# Patient Record
Sex: Male | Born: 1980 | Race: White | Hispanic: No | Marital: Single | Smoking: Current every day smoker
Health system: Southern US, Community
[De-identification: ages and names within clinical notes are randomized; demographics above are authoritative.]

## PROBLEM LIST (undated history)

## (undated) DIAGNOSIS — F329 Major depressive disorder, single episode, unspecified: Secondary | ICD-10-CM

## (undated) DIAGNOSIS — F419 Anxiety disorder, unspecified: Secondary | ICD-10-CM

## (undated) DIAGNOSIS — F1010 Alcohol abuse, uncomplicated: Secondary | ICD-10-CM

## (undated) DIAGNOSIS — G4700 Insomnia, unspecified: Secondary | ICD-10-CM

## (undated) DIAGNOSIS — F32A Depression, unspecified: Secondary | ICD-10-CM

---

## 2015-03-16 ENCOUNTER — Encounter: Payer: Self-pay | Admitting: Family Medicine

## 2015-03-16 ENCOUNTER — Ambulatory Visit (INDEPENDENT_AMBULATORY_CARE_PROVIDER_SITE_OTHER): Payer: 59 | Admitting: Family Medicine

## 2015-03-16 VITALS — BP 152/95 | HR 83 | Temp 98.9°F | Resp 18 | Ht 77.0 in | Wt 234.0 lb

## 2015-03-16 DIAGNOSIS — F4322 Adjustment disorder with anxiety: Secondary | ICD-10-CM | POA: Diagnosis not present

## 2015-03-16 DIAGNOSIS — R42 Dizziness and giddiness: Secondary | ICD-10-CM

## 2015-03-16 DIAGNOSIS — R0789 Other chest pain: Secondary | ICD-10-CM

## 2015-03-16 DIAGNOSIS — F410 Panic disorder [episodic paroxysmal anxiety] without agoraphobia: Secondary | ICD-10-CM

## 2015-03-16 DIAGNOSIS — E871 Hypo-osmolality and hyponatremia: Secondary | ICD-10-CM

## 2015-03-16 DIAGNOSIS — F1010 Alcohol abuse, uncomplicated: Secondary | ICD-10-CM | POA: Diagnosis not present

## 2015-03-16 LAB — POCT CBC
Granulocyte percent: 80.1 %G — AB (ref 37–80)
HCT, POC: 46.4 % (ref 43.5–53.7)
Hemoglobin: 15.5 g/dL (ref 14.1–18.1)
LYMPH, POC: 1.5 (ref 0.6–3.4)
MCH, POC: 31 pg (ref 27–31.2)
MCHC: 33.3 g/dL (ref 31.8–35.4)
MCV: 92.9 fL (ref 80–97)
MID (CBC): 0.3 (ref 0–0.9)
MPV: 7.9 fL (ref 0–99.8)
POC Granulocyte: 7 — AB (ref 2–6.9)
POC LYMPH %: 16.9 % (ref 10–50)
POC MID %: 3 %M (ref 0–12)
Platelet Count, POC: 117 10*3/uL — AB (ref 142–424)
RBC: 4.99 M/uL (ref 4.69–6.13)
RDW, POC: 13 %
WBC: 8.8 10*3/uL (ref 4.6–10.2)

## 2015-03-16 LAB — COMPREHENSIVE METABOLIC PANEL
ALBUMIN: 4.5 g/dL (ref 3.6–5.1)
ALT: 64 U/L — AB (ref 9–46)
AST: 49 U/L — AB (ref 10–40)
Alkaline Phosphatase: 52 U/L (ref 40–115)
BILIRUBIN TOTAL: 0.8 mg/dL (ref 0.2–1.2)
BUN: 8 mg/dL (ref 7–25)
CO2: 25 mmol/L (ref 20–31)
CREATININE: 0.73 mg/dL (ref 0.60–1.35)
Calcium: 8.9 mg/dL (ref 8.6–10.3)
Chloride: 88 mmol/L — ABNORMAL LOW (ref 98–110)
Glucose, Bld: 102 mg/dL — ABNORMAL HIGH (ref 65–99)
Potassium: 4.4 mmol/L (ref 3.5–5.3)
SODIUM: 125 mmol/L — AB (ref 135–146)
TOTAL PROTEIN: 7.4 g/dL (ref 6.1–8.1)

## 2015-03-16 LAB — TSH: TSH: 1.162 u[IU]/mL (ref 0.350–4.500)

## 2015-03-16 LAB — TROPONIN I

## 2015-03-16 LAB — GLUCOSE, POCT (MANUAL RESULT ENTRY): POC GLUCOSE: 103 mg/dL — AB (ref 70–99)

## 2015-03-16 MED ORDER — CHLORDIAZEPOXIDE HCL 25 MG PO CAPS: ORAL_CAPSULE | ORAL | Status: DC

## 2015-03-16 NOTE — Patient Instructions (Addendum)
I am sorry that you are having such a hard time today- it seems that your symptoms are caused by anxiety and also probably an element of alcohol withdrawal I will call you if the rest of your labs show any concerning findings. Otherwise we are going to have you use the librium as needed for anxiety and shakiness.  Remember while you are taking this you should not drink alcohol.    Coming off of alcohol can be difficult and sometimes dangerous.  Please have a friend or family member check on you a few times a day for the next few days.  If you are not doing ok, please go to the ER.   Otherwise it would be best to recheck you here in clinic tomorrow - this can be done at your convenience.    Quitting drinking may be a challenge- I would suggest that you join a support group of some type (AA or similar)- this can be really helpful.

## 2015-03-16 NOTE — Progress Notes (Addendum)
[facility] [address] [place] [address] [phone]  Date:  03/16/2015   Name:  Christopher Reid   DOB:  [birth year]   MRN:  [id number]  PCP:  No primary care provider on file.    Chief Complaint: No chief complaint on file.   History of Present Illness:  Christopher Reid is a 34 y.o. very pleasant male patient who presents with the following:  He has recently noted intermittent episodes of his eyes twitching, his "toes going numb."  He had some left sided chest pain- this started this morning while he was driving home.  It lasted maybe an hour and is now resolved He does not normally get chest pain  No SOB.    He has been under a lot of stress recnetly- for the last 6 months or so he feels that his job has been overwhelming.    He has never had any heart problems.  No family history either as far as he knows  He is a smoker.  Also admits that he has been drinking to excess for a few years. He tends to drink 4-6 liquor drinks each night.  He really wants to quit.  On discussion he does agree that some of his sx may be due to alcohol withdrawal Denies any drug use  He is separated from his wife currently. They have a 105 year old daughter. He works Engineer, manufacturing systems.    No vertigo- he does feel like he will sometimes feel dizzy just for a split second and then it clears.   He admits that he does not generally see doctors, is not aware of any history of HTN however.   There are no active problems to display for this patient.   No past medical history on file.  No past surgical history on file.  Social History  Substance Use Topics  . Smoking status: Not on file  . Smokeless tobacco: Not on file  . Alcohol Use: Not on file    No family history on file.  Allergies not on file  Medication list has been reviewed and updated.  No current outpatient prescriptions on file prior to visit.   No current facility-administered  medications on file prior to visit.    Review of Systems:  As per HPI- otherwise negative.   Physical Examination: Filed Vitals:   03/16/15 1459  BP: 178/102  Pulse: 101  Temp: 98.9 F (37.2 C)  Resp: 18   Filed Vitals:   03/16/15 1459  Height: 6' 3.5" (1.918 m)  Weight: 234 lb (106.142 kg)   Body mass index is 28.85 kg/(m^2). Ideal Body Weight: Weight in (lb) to have BMI = 25: 202.3  GEN: WDWN, NAD, Non-toxic, A & O x 3, tall build, Mild overweight HEENT: Atraumatic, Normocephalic. Neck supple. No masses, No LAD.  Bilateral TM wnl, oropharynx normal.  PEERL,EOMI.   Ears and Nose: No external deformity. CV: RRR, No M/G/R. No JVD. No thrill. No extra heart sounds. PULM: CTA B, no wheezes, crackles, rhonchi. No retractions. No resp. distress. No accessory muscle use. ABD: S, NT, ND EXTR: No c/c/e NEURO Normal gait.  PSYCH: Normally interactive. Conversant. Appears anxious and sweaty He has a fine tremor when both hands are extended in front of him, is slightly sweaty.   Went through CIWA-Ar with him- score of 6   EKG: SR with rate of 88, no acute or concerning finding  Results for orders placed  or performed in visit on 03/16/15  Troponin I  Result Value Ref Range   Troponin I <0.01 <0.06 ng/mL  POCT CBC  Result Value Ref Range   WBC 8.8 4.6 - 10.2 K/uL   Lymph, poc 1.5 0.6 - 3.4   POC LYMPH PERCENT 16.9 10 - 50 %L   MID (cbc) 0.3 0 - 0.9   POC MID % 3.0 0 - 12 %M   POC Granulocyte 7.0 (A) 2 - 6.9   Granulocyte percent 80.1 (A) 37 - 80 %G   RBC 4.99 4.69 - 6.13 M/uL   Hemoglobin 15.5 14.1 - 18.1 g/dL   HCT, POC 40.146.4 02.743.5 - 53.7 %   MCV 92.9 80 - 97 fL   MCH, POC 31.0 27 - 31.2 pg   MCHC 33.3 31.8 - 35.4 g/dL   RDW, POC 25.313.0 %   Platelet Count, POC 117 (A) 142 - 424 K/uL   MPV 7.9 0 - 99.8 fL  POCT glucose (manual entry)  Result Value Ref Range   POC Glucose 103 (A) 70 - 99 mg/dl    Pt rested in clinic for about an hour, drank a gatorade.  He felt calmer  and better.  Assessment and Plan: Adjustment disorder with anxious mood  Other chest pain - Plan: EKG 12-Lead, Troponin I  Dizziness and giddiness - Plan: POCT CBC, POCT glucose (manual entry), Comprehensive metabolic panel, TSH  Alcohol abuse - Plan: chlordiazePOXIDE (LIBRIUM) 25 MG capsule  Here today with likely panic attack/ alcohol withdrawal sx.  Discussed with pt in detail.  He would like to stop drinking.  rx for librium to use for the next few days to help prevent DTs and ease sx.  His CP is now resolved, EKG is reassuring. Offered to refer pt to ER for further evaluation and assurance that he does not have CAD. He declines this but did accept a stat troponin.  Encouraged him to stay home from work for a couple of days to rest and to have a close friend or family member monitor him for the next few days Await other labs and will check on him tomorrow   Elevated BP is likely due to stress but asked him to follow-up on this once feeling better  Meds ordered this encounter  Medications  . chlordiazePOXIDE (LIBRIUM) 25 MG capsule    Sig: Take 1 every 6 hours as needed for anxiety for 5 days.  For the first 24 hours may take 2 pills every 6 hours if needed    Dispense:  30 capsule    Refill:  0   Results for orders placed or performed in visit on 03/16/15  Comprehensive metabolic panel  Result Value Ref Range   Sodium 125 (L) 135 - 146 mmol/L   Potassium 4.4 3.5 - 5.3 mmol/L   Chloride 88 (L) 98 - 110 mmol/L   CO2 25 20 - 31 mmol/L   Glucose, Bld 102 (H) 65 - 99 mg/dL   BUN 8 7 - 25 mg/dL   Creat 6.640.73 4.030.60 - 4.741.35 mg/dL   Total Bilirubin 0.8 0.2 - 1.2 mg/dL   Alkaline Phosphatase 52 40 - 115 U/L   AST 49 (H) 10 - 40 U/L   ALT 64 (H) 9 - 46 U/L   Total Protein 7.4 6.1 - 8.1 g/dL   Albumin 4.5 3.6 - 5.1 g/dL   Calcium 8.9 8.6 - 25.910.3 mg/dL  Troponin I  Result Value Ref Range   Troponin I <  0.01 <0.06 ng/mL  TSH  Result Value Ref Range   TSH 1.162 0.350 - 4.500 uIU/mL  POCT  CBC  Result Value Ref Range   WBC 8.8 4.6 - 10.2 K/uL   Lymph, poc 1.5 0.6 - 3.4   POC LYMPH PERCENT 16.9 10 - 50 %L   MID (cbc) 0.3 0 - 0.9   POC MID % 3.0 0 - 12 %M   POC Granulocyte 7.0 (A) 2 - 6.9   Granulocyte percent 80.1 (A) 37 - 80 %G   RBC 4.99 4.69 - 6.13 M/uL   Hemoglobin 15.5 14.1 - 18.1 g/dL   HCT, POC 16.1 09.6 - 53.7 %   MCV 92.9 80 - 97 fL   MCH, POC 31.0 27 - 31.2 pg   MCHC 33.3 31.8 - 35.4 g/dL   RDW, POC 04.5 %   Platelet Count, POC 117 (A) 142 - 424 K/uL   MPV 7.9 0 - 99.8 fL  POCT glucose (manual entry)  Result Value Ref Range   POC Glucose 103 (A) 70 - 99 mg/dl     Signed Abbe Amsterdam, MD  Called pt to check on him on 10/28.  He reports that he slept well last night and is feeling well today.  Admits that he was worried about his BP and was eating nothing but bananas and water for a couple of days prior to visit.  He will increase his salt intake He feels that his anxiety and other sx are basically resolved. His "head does not feel weird anymore."   He will come in for a repeat lab in 2-3 days to check BMP/CBC  Called again on 11/2- LMOM. Reminded him to come in for labs and will send him a copy.  Also please see [place] soon for a BP check

## 2015-03-17 NOTE — Addendum Note (Signed)
Addended by: Abbe AmsterdamOPLAND, Jonathyn Carothers C on: 03/17/2015 07:17 PM   Modules accepted: Orders

## 2015-03-22 ENCOUNTER — Encounter: Payer: Self-pay | Admitting: Family Medicine

## 2015-06-18 ENCOUNTER — Ambulatory Visit (HOSPITAL_COMMUNITY)
Admission: RE | Admit: 2015-06-18 | Discharge: 2015-06-18 | Disposition: A | Discharge status: Home / Self Care | Payer: 59 | Attending: Psychiatry | Admitting: Psychiatry

## 2015-06-18 NOTE — BH Assessment (Signed)
Assessment Note  Christopher Reid is an 35 y.o. male, Caucasian , single who presents to [facility] as walk in. Patient identifies anxiety and depression as primary concerns. Patient states that he is stressed out from work and is needing to take on another position or different work. Patient states that he has had increasing anxiety and has increased alcohol consumption over past week. Patient states that he does live alone, but that he does have a dog. 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 Patient has reported anxiety and alcohol / substance abuse, and pt. Reports sleeping 8 or more hours per night. However, pt. Reports that he does not feel like getting out of bed in the a.m. Pt reports that his mother drove him to [facility], and that his mother will be staying with him overnight.  Patient denies current or past history of SI/HI. Patient acknowledges substance abuse history with alcohol and that he has within past week reported drinking 1/2 pint to 1 pint of vodka per day with most recent use on 06/18/15. Patient reports that has been drinking alcoholic beverages since the age of 19, and with random unspecified amounts on daily basis with report of drinking just before bed. Patient denies receiving and mental health or substance abuse inpatient or outpatient treatment. Patient denies current or past history of psychotic symptoms. Patient denies current or past history of AVH. Patient also states that he doe snot have a current primary care physician.  Patient is dressed in normal street attire and appears well groomed, but with scent of alcohol present. Patient is alert and oriented x4. Patient speech was within normal limits and motor behavior appeared normal. Patient thought process is coherent. Patient does not appear to be responding to internal stimuli. Patient was cooperative throughout the assessment and states that he is not agreeable to inpatient psychiatric  treatment.   Diagnosis: 303.90 [F10.20] Alcohol use Disorder, Severe  Past Medical History: No past medical history on file.  No past surgical history on file.  Family History: No family history on file.  Social History:  reports that he has been smoking.  He has never used smokeless tobacco. He reports that he does not drink alcohol or use illicit drugs.  Additional Social History:  Alcohol / Drug Use Pain Medications: none Prescriptions: none  Over the Counter: none History of alcohol / drug use?: Yes Longest period of sobriety (when/how long): pt. does not recall Negative Consequences of Use: Personal relationships, Financial, Work / School Withdrawal Symptoms: Patient aware of relationship between substance abuse and physical/medical complications Substance #1 Name of Substance 1: alcohol 1 - Age of First Use: 16 1 - Amount (size/oz): most recent 1/2 pint to 1 pint vodka 1 - Frequency: daily for past week most recent 1 - Duration: years 1 - Last Use / Amount: 06/18/15  CIWA:   COWS:    Allergies: No Known Allergies  Home Medications:  (Not in a hospital admission)  OB/GYN Status:  No LMP for male patient.  General Assessment Data Location of Assessment: [facility] Assessment Services TTS Assessment: In system Is this a Tele or Face-to-Face Assessment?: Face-to-Face Is this an Initial Assessment or a Re-assessment for this encounter?: Initial Assessment Marital status: Single Maiden name: NA Is patient pregnant?: No Pregnancy Status: No Living Arrangements: Alone Can pt return to current living arrangement?: Yes Admission Status: Voluntary Is patient capable of signing voluntary admission?: Yes Referral Source: Self/Family/Friend Insurance type:  Research officer, trade union Exam [facility] Walk-in ONLY) Medical Exam completed: No Reason for MSE not completed: Patient Refused  Crisis Care Plan Living Arrangements: Alone Name of Psychiatrist: none Name of  Therapist: none  Education Status Is patient currently in school?: No Current Grade: nA Highest grade of school patient has completed: college  Name of school: [employer] person: Harrietta Guardian (([phone])  Risk to self with the past 6 months Suicidal Ideation: No Has patient been a risk to self within the past 6 months prior to admission? : No Suicidal Intent: No Has patient had any suicidal intent within the past 6 months prior to admission? : No Is patient at risk for suicide?: No Suicidal Plan?: No Has patient had any suicidal plan within the past 6 months prior to admission? : No Access to Means: No What has been your use of drugs/alcohol within the last 12 months?: alcohol Previous Attempts/Gestures: No How many times?: 0 Other Self Harm Risks: none Triggers for Past Attempts: Unknown Intentional Self Injurious Behavior: None Family Suicide History: No Recent stressful life event(s): Trauma (Comment), Turmoil (Comment) (work related stress/ anxiety related to substance abuse) Persecutory voices/beliefs?: No Depression: Yes Depression Symptoms: Despondent, Insomnia, Tearfulness, Isolating, Fatigue, Guilt, Loss of interest in usual pleasures, Feeling worthless/self pity Substance abuse history and/or treatment for substance abuse?: Yes (no treatment hx.) Suicide prevention information given to non-admitted patients: Not applicable  Risk to Others within the past 6 months Homicidal Ideation: No Does patient have any lifetime risk of violence toward others beyond the six months prior to admission? : No Thoughts of Harm to Others: No Current Homicidal Intent: No Current Homicidal Plan: No Access to Homicidal Means: No Identified Victim: NA History of harm to others?: No Assessment of Violence: None Noted Does patient have access to weapons?: No Criminal Charges Pending?: No Does patient have a court date: No Is patient on probation?:  No  Psychosis Hallucinations: None noted Delusions: None noted  Mental Status Report Appearance/Hygiene: Disheveled Eye Contact: Fair Motor Activity: Restlessness, Agitation, Unsteady Speech: Unremarkable Level of Consciousness: Alert Mood: Depressed, Anxious, Ambivalent, Apprehensive, Ashamed/humiliated, Helpless, Guilty, Preoccupied, Sad Affect: Anxious, Depressed Anxiety Level: Panic Attacks Panic attack frequency: random Most recent panic attack: 06/09/15 Thought Processes: Coherent, Relevant Judgement: Partial Orientation: Person, Place, Time, Situation, Appropriate for developmental age Obsessive Compulsive Thoughts/Behaviors: None  Cognitive Functioning Concentration: Normal Memory: Recent Intact, Remote Intact IQ: Average Insight: Good Impulse Control: Fair Appetite: Poor Weight Loss: 15 Weight Gain: 0 Sleep: Increased Total Hours of Sleep: 8 Vegetative Symptoms: Staying in bed  ADLScreening [facility] Assessment Services) Patient's cognitive ability adequate to safely complete daily activities?: Yes Patient able to express need for assistance with ADLs?: Yes Independently performs ADLs?: Yes (appropriate for developmental age)  Prior Inpatient Therapy Prior Inpatient Therapy: No Prior Therapy Dates: NA Prior Therapy Facilty/Provider(s): NA Reason for Treatment: NA  Prior Outpatient Therapy Prior Outpatient Therapy: No Prior Therapy Dates: NA Prior Therapy Facilty/Provider(s): Na Reason for Treatment: NA Does patient have an ACCT team?: No Does patient have Intensive In-House Services?  : No Does patient have [facility] services? : No  ADL Screening (condition at time of admission) Patient's cognitive ability adequate to safely complete daily activities?: Yes Is the patient deaf or have difficulty hearing?: No Does the patient have difficulty seeing, even when wearing glasses/contacts?: No Does the patient have difficulty concentrating, remembering, or making  decisions?: No Patient able to express need for assistance with ADLs?: Yes Does the  patient have difficulty dressing or bathing?: No Independently performs ADLs?: Yes (appropriate for developmental age) Does the patient have difficulty walking or climbing stairs?: No Weakness of Legs: None Weakness of Arms/Hands: None  Home Assistive Devices/Equipment Home Assistive Devices/Equipment: None    Abuse/Neglect Assessment (Assessment to be complete while patient is alone) Physical Abuse: Denies Verbal Abuse: Denies Sexual Abuse: Denies Exploitation of patient/patient's resources: Denies Self-Neglect: Denies Values / Beliefs Cultural Requests During Hospitalization: None Spiritual Requests During Hospitalization: None Consults Spiritual Care Consult Needed: No Social Work Consult Needed: No Merchant navy officer (For Healthcare) Does patient have an advance directive?: No Would patient like information on creating an advanced directive?: No - patient declined information    Additional Information 1:1 In Past 12 Months?: No CIRT Risk: No Elopement Risk: No Does patient have medical clearance?: Yes     Disposition: Per Vernona Rieger, NP does not meet inpatient criteria. Patient signed waiver for medical exam , and is leaving with mother for transportation.  Patient received outpatient resources for AA meetings and outpatient substance abuse. Pt was also educated and made aware of potential safety issues, and dangers of alcohol withdrawal. Disposition Initial Assessment Completed for this Encounter: Yes Disposition of Patient: Outpatient treatment (AA meetings/ support and ADS services outpt.) Type of outpatient treatment: Adult (Substance abuse outpt.)  On Site Evaluation by:   Reviewed with Physician:    Hipolito Bayley 06/18/2015 5:51 PM

## 2015-06-18 NOTE — BHH Counselor (Signed)
This Clinical research associate consulted with Vernona Rieger, NP, and pt. Does not meet inpt. Criteria. Pt was given outpatient resources for AA meetings and was encouraged to seek sponsorship. and received education and made aware of dangers of alcohol, withdrawal. Pt signed waiver of medical exam. Pt. Was d/c and stated that his mother would be with him overnight ,and that mother was transporting him back home. This Clinical research associate escorted pt. To lobby and did see that pt. Does have person to transport him home. Lyberti Thrush K. Jesus Genera, [facility]  Counselor 06/18/2015 6:17 PM  .

## 2015-09-08 ENCOUNTER — Encounter (HOSPITAL_COMMUNITY): Payer: Self-pay | Admitting: Emergency Medicine

## 2015-09-08 ENCOUNTER — Inpatient Hospital Stay (HOSPITAL_COMMUNITY)
Admission: EM | Admit: 2015-09-08 | Discharge: 2015-09-12 | DRG: 897 | Disposition: A | Discharge status: Home / Self Care | Payer: 59 | Attending: Internal Medicine | Admitting: Internal Medicine

## 2015-09-08 DIAGNOSIS — F1010 Alcohol abuse, uncomplicated: Secondary | ICD-10-CM

## 2015-09-08 DIAGNOSIS — Z720 Tobacco use: Secondary | ICD-10-CM

## 2015-09-08 DIAGNOSIS — F10230 Alcohol dependence with withdrawal, uncomplicated: Secondary | ICD-10-CM | POA: Diagnosis present

## 2015-09-08 DIAGNOSIS — K7010 Alcoholic hepatitis without ascites: Secondary | ICD-10-CM | POA: Diagnosis present

## 2015-09-08 DIAGNOSIS — R45851 Suicidal ideations: Secondary | ICD-10-CM | POA: Diagnosis present

## 2015-09-08 DIAGNOSIS — R945 Abnormal results of liver function studies: Secondary | ICD-10-CM

## 2015-09-08 DIAGNOSIS — F10239 Alcohol dependence with withdrawal, unspecified: Principal | ICD-10-CM | POA: Diagnosis present

## 2015-09-08 DIAGNOSIS — F32A Depression, unspecified: Secondary | ICD-10-CM

## 2015-09-08 DIAGNOSIS — I10 Essential (primary) hypertension: Secondary | ICD-10-CM

## 2015-09-08 DIAGNOSIS — E872 Acidosis: Secondary | ICD-10-CM | POA: Diagnosis present

## 2015-09-08 DIAGNOSIS — F1024 Alcohol dependence with alcohol-induced mood disorder: Secondary | ICD-10-CM | POA: Diagnosis present

## 2015-09-08 DIAGNOSIS — Y908 Blood alcohol level of 240 mg/100 ml or more: Secondary | ICD-10-CM | POA: Diagnosis present

## 2015-09-08 DIAGNOSIS — F17200 Nicotine dependence, unspecified, uncomplicated: Secondary | ICD-10-CM | POA: Diagnosis present

## 2015-09-08 DIAGNOSIS — F1094 Alcohol use, unspecified with alcohol-induced mood disorder: Secondary | ICD-10-CM | POA: Diagnosis present

## 2015-09-08 DIAGNOSIS — R569 Unspecified convulsions: Secondary | ICD-10-CM

## 2015-09-08 DIAGNOSIS — F10939 Alcohol use, unspecified with withdrawal, unspecified: Secondary | ICD-10-CM | POA: Diagnosis present

## 2015-09-08 DIAGNOSIS — F329 Major depressive disorder, single episode, unspecified: Secondary | ICD-10-CM | POA: Diagnosis present

## 2015-09-08 DIAGNOSIS — R7989 Other specified abnormal findings of blood chemistry: Secondary | ICD-10-CM

## 2015-09-08 HISTORY — DX: Alcohol abuse, uncomplicated: F10.10

## 2015-09-08 LAB — CBC
HCT: 47.3 % (ref 39.0–52.0)
Hemoglobin: 16.7 g/dL (ref 13.0–17.0)
MCH: 32.8 pg (ref 26.0–34.0)
MCHC: 35.3 g/dL (ref 30.0–36.0)
MCV: 92.9 fL (ref 78.0–100.0)
PLATELETS: 116 10*3/uL — AB (ref 150–400)
RBC: 5.09 MIL/uL (ref 4.22–5.81)
RDW: 13.4 % (ref 11.5–15.5)
WBC: 7.3 10*3/uL (ref 4.0–10.5)

## 2015-09-08 LAB — COMPREHENSIVE METABOLIC PANEL
ALK PHOS: 61 U/L (ref 38–126)
ALT: 130 U/L — AB (ref 17–63)
AST: 111 U/L — ABNORMAL HIGH (ref 15–41)
Albumin: 4.5 g/dL (ref 3.5–5.0)
Anion gap: 18 — ABNORMAL HIGH (ref 5–15)
BILIRUBIN TOTAL: 0.4 mg/dL (ref 0.3–1.2)
BUN: 8 mg/dL (ref 6–20)
CALCIUM: 9 mg/dL (ref 8.9–10.3)
CHLORIDE: 104 mmol/L (ref 101–111)
CO2: 21 mmol/L — ABNORMAL LOW (ref 22–32)
CREATININE: 0.74 mg/dL (ref 0.61–1.24)
Glucose, Bld: 109 mg/dL — ABNORMAL HIGH (ref 65–99)
Potassium: 4 mmol/L (ref 3.5–5.1)
Sodium: 143 mmol/L (ref 135–145)
TOTAL PROTEIN: 8.2 g/dL — AB (ref 6.5–8.1)

## 2015-09-08 LAB — RAPID URINE DRUG SCREEN, HOSP PERFORMED
AMPHETAMINES: NOT DETECTED
BENZODIAZEPINES: NOT DETECTED
Barbiturates: NOT DETECTED
Cocaine: NOT DETECTED
OPIATES: NOT DETECTED
Tetrahydrocannabinol: NOT DETECTED

## 2015-09-08 LAB — ETHANOL: ALCOHOL ETHYL (B): 416 mg/dL — AB (ref <5)

## 2015-09-08 NOTE — ED Notes (Signed)
Pt presents with family requesting help with detox, pt appears very intoxicated. Pt states he has been drinking @15  airplane bottles of Vodka a day since @ age 35. Pt has attempted to detox at home in the past but becomes "sick". Pt and family deny seizure hx.

## 2015-09-09 DIAGNOSIS — R945 Abnormal results of liver function studies: Secondary | ICD-10-CM

## 2015-09-09 DIAGNOSIS — F329 Major depressive disorder, single episode, unspecified: Secondary | ICD-10-CM

## 2015-09-09 DIAGNOSIS — I10 Essential (primary) hypertension: Secondary | ICD-10-CM

## 2015-09-09 DIAGNOSIS — F1094 Alcohol use, unspecified with alcohol-induced mood disorder: Secondary | ICD-10-CM | POA: Diagnosis not present

## 2015-09-09 DIAGNOSIS — R45851 Suicidal ideations: Secondary | ICD-10-CM | POA: Diagnosis not present

## 2015-09-09 DIAGNOSIS — I158 Other secondary hypertension: Secondary | ICD-10-CM

## 2015-09-09 DIAGNOSIS — F10939 Alcohol use, unspecified with withdrawal, unspecified: Secondary | ICD-10-CM | POA: Diagnosis present

## 2015-09-09 DIAGNOSIS — F10239 Alcohol dependence with withdrawal, unspecified: Secondary | ICD-10-CM | POA: Diagnosis present

## 2015-09-09 DIAGNOSIS — F10230 Alcohol dependence with withdrawal, uncomplicated: Secondary | ICD-10-CM | POA: Diagnosis present

## 2015-09-09 DIAGNOSIS — Z720 Tobacco use: Secondary | ICD-10-CM

## 2015-09-09 DIAGNOSIS — R7989 Other specified abnormal findings of blood chemistry: Secondary | ICD-10-CM

## 2015-09-09 DIAGNOSIS — F32A Depression, unspecified: Secondary | ICD-10-CM

## 2015-09-09 LAB — MRSA PCR SCREENING: MRSA BY PCR: NEGATIVE

## 2015-09-09 LAB — ETHANOL
ALCOHOL ETHYL (B): 177 mg/dL — AB (ref <5)
Alcohol, Ethyl (B): 324 mg/dL (ref <5)

## 2015-09-09 MED ORDER — NICOTINE 21 MG/24HR TD PT24
21.0000 mg | MEDICATED_PATCH | Freq: Every day | TRANSDERMAL | Status: DC
  Administered 2015-09-09 – 2015-09-12 (×4): 21 mg via TRANSDERMAL
  Filled 2015-09-09 (×4): qty 1

## 2015-09-09 MED ORDER — LORAZEPAM 2 MG/ML IJ SOLN
2.0000 mg | Freq: Once | INTRAMUSCULAR | Status: AC
  Administered 2015-09-09: 2 mg via INTRAVENOUS
  Filled 2015-09-09: qty 1

## 2015-09-09 MED ORDER — METOPROLOL TARTRATE 1 MG/ML IV SOLN
5.0000 mg | Freq: Once | INTRAVENOUS | Status: AC
  Administered 2015-09-09: 5 mg via INTRAVENOUS

## 2015-09-09 MED ORDER — ALUM & MAG HYDROXIDE-SIMETH 200-200-20 MG/5ML PO SUSP: 30.0000 mL | ORAL | Status: DC | PRN

## 2015-09-09 MED ORDER — VITAMIN B-1 100 MG PO TABS
100.0000 mg | ORAL_TABLET | Freq: Every day | ORAL | Status: DC
  Administered 2015-09-09 – 2015-09-12 (×4): 100 mg via ORAL
  Filled 2015-09-09 (×4): qty 1

## 2015-09-09 MED ORDER — LORAZEPAM 1 MG PO TABS
2.0000 mg | ORAL_TABLET | Freq: Once | ORAL | Status: AC
  Administered 2015-09-09: 2 mg via ORAL
  Filled 2015-09-09: qty 2

## 2015-09-09 MED ORDER — LORAZEPAM 1 MG PO TABS: 1.0000 mg | ORAL_TABLET | Freq: Three times a day (TID) | ORAL | Status: DC | PRN

## 2015-09-09 MED ORDER — HYDRALAZINE HCL 20 MG/ML IJ SOLN
10.0000 mg | Freq: Four times a day (QID) | INTRAMUSCULAR | Status: DC | PRN
  Administered 2015-09-09 – 2015-09-10 (×3): 10 mg via INTRAVENOUS
  Filled 2015-09-09 (×3): qty 1

## 2015-09-09 MED ORDER — IBUPROFEN 200 MG PO TABS: 600.0000 mg | ORAL_TABLET | Freq: Three times a day (TID) | ORAL | Status: DC | PRN

## 2015-09-09 MED ORDER — FOLIC ACID 1 MG PO TABS
1.0000 mg | ORAL_TABLET | Freq: Every day | ORAL | Status: DC
  Administered 2015-09-09 – 2015-09-12 (×4): 1 mg via ORAL
  Filled 2015-09-09 (×4): qty 1

## 2015-09-09 MED ORDER — METOPROLOL TARTRATE 1 MG/ML IV SOLN
INTRAVENOUS | Status: AC
  Filled 2015-09-09: qty 5

## 2015-09-09 MED ORDER — THIAMINE HCL 100 MG/ML IJ SOLN
Freq: Once | INTRAVENOUS | Status: AC
  Administered 2015-09-09: 16:00:00 via INTRAVENOUS
  Filled 2015-09-09: qty 1000

## 2015-09-09 MED ORDER — ACETAMINOPHEN 650 MG RE SUPP: 650.0000 mg | Freq: Four times a day (QID) | RECTAL | Status: DC | PRN

## 2015-09-09 MED ORDER — ADULT MULTIVITAMIN W/MINERALS CH
1.0000 | ORAL_TABLET | Freq: Every day | ORAL | Status: DC
  Administered 2015-09-09 – 2015-09-12 (×4): 1 via ORAL
  Filled 2015-09-09 (×4): qty 1

## 2015-09-09 MED ORDER — SODIUM CHLORIDE 0.9 % IV BOLUS (SEPSIS)
1000.0000 mL | Freq: Once | INTRAVENOUS | Status: AC
Start: 2015-09-09 — End: 2015-09-09
  Administered 2015-09-09: 1000 mL via INTRAVENOUS

## 2015-09-09 MED ORDER — LORAZEPAM 2 MG/ML IJ SOLN
2.0000 mg | INTRAMUSCULAR | Status: DC | PRN
  Administered 2015-09-09 – 2015-09-11 (×7): 2 mg via INTRAVENOUS
  Filled 2015-09-09 (×7): qty 1

## 2015-09-09 MED ORDER — ONDANSETRON HCL 4 MG PO TABS: 4.0000 mg | ORAL_TABLET | Freq: Four times a day (QID) | ORAL | Status: DC | PRN

## 2015-09-09 MED ORDER — NICOTINE 21 MG/24HR TD PT24: 21.0000 mg | MEDICATED_PATCH | Freq: Every day | TRANSDERMAL | Status: DC

## 2015-09-09 MED ORDER — GABAPENTIN 300 MG PO CAPS
300.0000 mg | ORAL_CAPSULE | Freq: Three times a day (TID) | ORAL | Status: DC
  Administered 2015-09-09 – 2015-09-12 (×9): 300 mg via ORAL
  Filled 2015-09-09 (×12): qty 1

## 2015-09-09 MED ORDER — ZOLPIDEM TARTRATE 5 MG PO TABS
5.0000 mg | ORAL_TABLET | Freq: Every evening | ORAL | Status: DC | PRN
  Administered 2015-09-10 (×2): 5 mg via ORAL
  Filled 2015-09-09 (×2): qty 1

## 2015-09-09 MED ORDER — ACETAMINOPHEN 325 MG PO TABS
650.0000 mg | ORAL_TABLET | Freq: Four times a day (QID) | ORAL | Status: DC | PRN
  Administered 2015-09-10: 650 mg via ORAL
  Filled 2015-09-09: qty 2

## 2015-09-09 MED ORDER — ENOXAPARIN SODIUM 40 MG/0.4ML ~~LOC~~ SOLN
40.0000 mg | SUBCUTANEOUS | Status: DC
  Administered 2015-09-09: 40 mg via SUBCUTANEOUS
  Filled 2015-09-09: qty 0.4

## 2015-09-09 MED ORDER — ACETAMINOPHEN 325 MG PO TABS: 650.0000 mg | ORAL_TABLET | ORAL | Status: DC | PRN

## 2015-09-09 MED ORDER — SODIUM CHLORIDE 0.9 % IV SOLN
INTRAVENOUS | Status: DC
  Administered 2015-09-10 – 2015-09-11 (×3): via INTRAVENOUS

## 2015-09-09 MED ORDER — ONDANSETRON HCL 4 MG PO TABS: 4.0000 mg | ORAL_TABLET | Freq: Three times a day (TID) | ORAL | Status: DC | PRN

## 2015-09-09 MED ORDER — SODIUM CHLORIDE 0.9 % IV BOLUS (SEPSIS)
1000.0000 mL | Freq: Once | INTRAVENOUS | Status: AC
  Administered 2015-09-09: 1000 mL via INTRAVENOUS

## 2015-09-09 MED ORDER — ONDANSETRON HCL 4 MG/2ML IJ SOLN: 4.0000 mg | Freq: Four times a day (QID) | INTRAMUSCULAR | Status: DC | PRN

## 2015-09-09 MED ORDER — LORAZEPAM 1 MG PO TABS: 0.0000 mg | ORAL_TABLET | Freq: Two times a day (BID) | ORAL | Status: DC

## 2015-09-09 MED ORDER — LORAZEPAM 1 MG PO TABS
0.0000 mg | ORAL_TABLET | Freq: Four times a day (QID) | ORAL | Status: DC
  Administered 2015-09-09: 2 mg via ORAL
  Filled 2015-09-09: qty 4

## 2015-09-09 NOTE — ED Notes (Signed)
Nicole, our P.A. Sees him at this time; and I receive an order for an additional dose of Ativan. He states he remains anxious.  He is much less shaky and is no longer diaphoretic.

## 2015-09-09 NOTE — BH Assessment (Signed)
Assessment Note  Christopher Reid is an 35 y.o. male. Patient brought into the ED by father because of alcohol detox.  Patient denies SI/HI, A/VH, and other self-injurious behaviors.  Patient reports drinking a pint of vodka daily for the past 5 years.  Patient denies treatment history.  Patient reports current consequences to alcoholism is unemployment, relationship conflicts, separated from wife and children 16 months ago, and symptoms of depression.  Patient reports an inability motivate self to get out of bed in the morning, insomnia, worthlessness, and increased anxiety.    This Clinical research associate consulted with Dr. Shela Commons it is recommended to refer for inpatient detox pending medical clearance.    Diagnosis: Substance Induced Mood Disorder; Alcohol use, severe  Past Medical History:  Past Medical History  Diagnosis Date  . Alcohol abuse     History reviewed. No pertinent past surgical history.  Family History: No family history on file.  Social History:  reports that he has been smoking.  He has never used smokeless tobacco. He reports that he drinks alcohol. He reports that he does not use illicit drugs.  Additional Social History:  Alcohol / Drug Use Pain Medications: see chart  Prescriptions: see chart Over the Counter: see chart History of alcohol / drug use?: Yes Longest period of sobriety (when/how long): 1 week Negative Consequences of Use: Financial, Personal relationships, Work / Programmer, multimedia Withdrawal Symptoms: Sweats, Tingling, Irritability, Tremors, Nausea / Vomiting Substance #1 Name of Substance 1: alcohol 1 - Age of First Use: 16  1 - Amount (size/oz): pint 1 - Frequency: daily 1 - Duration: ongoing 1 - Last Use / Amount: 4/22 pint vodka  CIWA: CIWA-Ar BP: 137/64 mmHg Pulse Rate: 78 Nausea and Vomiting: mild nausea with no vomiting Tactile Disturbances: mild itching, pins and needles, burning or numbness Tremor: not visible, but can be felt fingertip to fingertip Auditory  Disturbances: not present Paroxysmal Sweats: barely perceptible sweating, palms moist Visual Disturbances: not present Anxiety: mildly anxious Headache, Fullness in Head: none present Agitation: normal activity Orientation and Clouding of Sensorium: oriented and can do serial additions CIWA-Ar Total: 6 COWS:    Allergies: No Known Allergies  Home Medications:  (Not in a hospital admission)  OB/GYN Status:  No LMP for male patient.  General Assessment Data Location of Assessment: [facility] TTS Assessment: In system Is this a Tele or Face-to-Face Assessment?: Face-to-Face Is this an Initial Assessment or a Re-assessment for this encounter?: Initial Assessment Marital status: Separated Is patient pregnant?: No Pregnancy Status: No Living Arrangements: Alone Can pt return to current living arrangement?: Yes Admission Status: Voluntary Is patient capable of signing voluntary admission?: No Referral Source: Self/Family/Friend Insurance type: none  Medical Screening Exam [facility] Walk-in ONLY) Medical Exam completed: Yes  Crisis Care Plan Living Arrangements: Alone Name of Psychiatrist: none Name of Therapist: none  Education Status Is patient currently in school?: No Highest grade of school patient has completed: 12  Risk to self with the past 6 months Suicidal Ideation: No-Not Currently/Within Last 6 Months Has patient been a risk to self within the past 6 months prior to admission? : No Suicidal Intent: No-Not Currently/Within Last 6 Months Has patient had any suicidal intent within the past 6 months prior to admission? : No Is patient at risk for suicide?: No Suicidal Plan?: No-Not Currently/Within Last 6 Months Has patient had any suicidal plan within the past 6 months prior to admission? : No Access to Means: No What has been your use of drugs/alcohol  within the last 12 months?: alcohol Previous Attempts/Gestures: No Intentional Self Injurious Behavior: None Family  Suicide History: No Recent stressful life event(s): Conflict (Comment), Loss (Comment), Job Loss, Financial Problems, Other (Comment) (SA) Persecutory voices/beliefs?: No Depression: Yes Depression Symptoms: Insomnia, Isolating, Fatigue, Guilt, Loss of interest in usual pleasures, Feeling worthless/self pity, Feeling angry/irritable Substance abuse history and/or treatment for substance abuse?: Yes  Risk to Others within the past 6 months Homicidal Ideation: No-Not Currently/Within Last 6 Months Does patient have any lifetime risk of violence toward others beyond the six months prior to admission? : No Thoughts of Harm to Others: No-Not Currently Present/Within Last 6 Months Current Homicidal Intent: No-Not Currently/Within Last 6 Months Current Homicidal Plan: No-Not Currently/Within Last 6 Months Access to Homicidal Means: No History of harm to others?: No Assessment of Violence: None Noted Does patient have access to weapons?: No Criminal Charges Pending?: No Does patient have a court date: No Is patient on probation?: No  Psychosis Hallucinations: None noted Delusions: None noted  Mental Status Report Appearance/Hygiene: In scrubs Eye Contact: Fair Motor Activity: Freedom of movement Speech: Logical/coherent Level of Consciousness: Alert Mood: Depressed, Anxious Affect: Anxious, Depressed Anxiety Level: Minimal Thought Processes: Relevant, Coherent Judgement: Unimpaired Obsessive Compulsive Thoughts/Behaviors: None  Cognitive Functioning Concentration: Fair Memory: Recent Intact, Remote Intact IQ: Average Insight: Fair Impulse Control: Fair Appetite: Poor Weight Loss: 0 Weight Gain: 0 Sleep: No Change Total Hours of Sleep: 3 Vegetative Symptoms: None  ADLScreening [facility]) Patient's cognitive ability adequate to safely complete daily activities?: Yes Patient able to express need for assistance with ADLs?: Yes Independently performs ADLs?: Yes  (appropriate for developmental age)  Prior Inpatient Therapy Prior Inpatient Therapy: No  Prior Outpatient Therapy Prior Outpatient Therapy: No Does patient have an ACCT team?: No Does patient have Intensive In-House Services?  : No Does patient have [facility] services? : No Does patient have P4CC services?: No  ADL Screening (condition at time of admission) Patient's cognitive ability adequate to safely complete daily activities?: Yes Patient able to express need for assistance with ADLs?: Yes Independently performs ADLs?: Yes (appropriate for developmental age)       Abuse/Neglect Assessment (Assessment to be complete while patient is alone) Physical Abuse: Denies Verbal Abuse: Denies Sexual Abuse: Denies Exploitation of patient/patient's resources: Denies Self-Neglect: Denies Values / Beliefs Cultural Requests During Hospitalization: None Spiritual Requests During Hospitalization: None Consults Spiritual Care Consult Needed: No Social Work Consult Needed: No Merchant navy officerAdvance Directives (For Healthcare) Does patient have an advance directive?: No Would patient like information on creating an advanced directive?: No - patient declined information    Additional Information 1:1 In Past 12 Months?: No CIRT Risk: No Elopement Risk: No Does patient have medical clearance?: Yes     Disposition:  Disposition Initial Assessment Completed for this Encounter: Yes Disposition of Patient: Other dispositions (Pending medical clearance) Other disposition(s): Other (Comment) (Pending medical clearance)  On Site Evaluation by:   Reviewed with Physician:    Maryelizabeth Rowanorbett, Jonathon Castelo A 09/09/2015 9:28 AM

## 2015-09-09 NOTE — ED Provider Notes (Signed)
CSN: [id number]     Arrival date & time 09/08/15  2217 History   First MD Initiated Contact with Patient 09/09/15 0012     Chief Complaint  Patient presents with  . Alcohol Problem     (Consider location/radiation/quality/duration/timing/severity/associated sxs/prior Treatment) HPI  Christopher Reid is a(n) 35 y.o. male who presents to the ED with the CC of etoh abuse. The patient has been Drinking heavily since he was a teenager. His parents came from [place] to help him get off of etoh. He is dependent on ETOH. He has tried to detox at home previously without success but has never been to inpatient treatment. He admits to drinking about 1/5 liqour a day for about 10 years. He drank at least that prior to arrival. He states that he gets the shakes and vomits without etoh. The patient's parents took him [facility] the street [facility]. He was immediately referred to the ED for medical clearance because of his level of intoxication. The patient states that heis addicted to alcohol and does not want to live like this. He denies suicidal ideation, homicidal ideation, audio or visual hallucinations. He is unsure of his ever had a seizure coming off of alcohol, but does deny a hallucinations with detox. He denies any other drug abuse.  Past Medical History  Diagnosis Date  . Alcohol abuse    History reviewed. No pertinent past surgical history. No family history on file. Social History  Substance Use Topics  . Smoking status: Current Every Day Smoker  . Smokeless tobacco: Never Used  . Alcohol Use: 0.0 oz/week    0 Standard drinks or equivalent per week     Comment: heavy    Review of Systems  Unable to perform ROS: Other (Alcohol intoxication)      Allergies  Review of patient's allergies indicates no known allergies.  Home Medications   Prior to Admission medications   Medication Sig Start Date End Date Taking? Authorizing Provider  chlordiazePOXIDE  (LIBRIUM) 25 MG capsule Take 1 every 6 hours as needed for anxiety for 5 days.  For the first 24 hours may take 2 pills every 6 hours if needed Patient not taking: Reported on 09/08/2015 03/16/15   Gwenlyn FoundJessica C Copland, MD   BP 171/128 mmHg  Pulse 132  Temp(Src) 98.1 F (36.7 C) (Oral)  Resp 18  SpO2 96% Physical Exam  Constitutional: He appears well-developed and well-nourished. No distress.  HENT:  Head: Normocephalic and atraumatic.  Eyes: Conjunctivae are normal. No scleral icterus.  Neck: Normal range of motion. Neck supple.  Cardiovascular: Normal rate, regular rhythm and normal heart sounds.   Pulmonary/Chest: Effort normal and breath sounds normal. No respiratory distress.  Abdominal: Soft. There is no tenderness.  Musculoskeletal: He exhibits no edema.  Neurological: He is alert.  Skin: Skin is warm and dry. He is not diaphoretic.  Psychiatric: His behavior is normal.  Patient is anxious, pacing the floor. Gait is normal. He is somewhat histrionic, however, his speech is normal and his responses are appropriate with an alcohol level of 416.  Nursing note and vitals reviewed.   ED Course  Procedures (including critical care time) Labs Review Labs Reviewed  ETHANOL - Abnormal; Notable for the following:    Alcohol, Ethyl (B) 416 (*)    All other components within normal limits  COMPREHENSIVE METABOLIC PANEL - Abnormal; Notable for the following:    CO2 21 (*)    Glucose, Bld 109 (*)  Total Protein 8.2 (*)    AST 111 (*)    ALT 130 (*)    Anion gap 18 (*)    All other components within normal limits  CBC - Abnormal; Notable for the following:    Platelets 116 (*)    All other components within normal limits  URINE RAPID DRUG SCREEN, HOSP PERFORMED    Imaging Review No results found. I have personally reviewed and evaluated these images and lab results as part of my medical decision-making.   EKG Interpretation None      MDM   Final diagnoses:  None     Patient here with alcohol dependence. He is on CPAP protocol getting fluids and IV Ativan. His parents are from out of town and do not know the system. The patient appears to want detox. Hoping that he can get some help with placement of on her workup. He does not appear to have any cough insurance at this time. His labs show anion gap acidosis process suspect is secondary to alcoholic ketosis. He is getting fluids. He is otherwise well-appearing. He has transaminitis. I spoke with the TTS specialist, who asked that they be contacted when his alcohol level which is 200.  Patient repeat BAH 324, Will continue to monitor, may need medical admission. I have given report to PA Pisciotta who will assume care.  Arthor Captain, PA-C 09/09/15 2329  Dione Booze, MD 09/10/15 [phone]

## 2015-09-09 NOTE — ED Provider Notes (Signed)
PROGRESS NOTE                                                                                                                 This is a sign-out from PA Harris at shift change: Christopher Reid is a 35 y.o. male presenting with request for alcohol detox. Parents are in from out of state to help him with this. Patient has attempted detox in the past but becomes ill, no history of DTs, seizures or hallucinations. Alcohol level over 400 and patient was tremulous, actively withdrawing. Patient given 2 mg of Ativan and will reassess. TTS will be able to assess the patient when ethanol level is around 200. 2 follow-up repeat alcohol level. Please refer to previous note for full HPI, ROS, PMH and PE.   6:30 AM: Resting comfortably, sleeping.   Ethanol level under 200, TTS consult initiated, holding orders placed. Patient is medically cleared for psychiatric evaluation.  Wynetta Emeryicole Elo Marmolejos, PA-C 09/09/15 [phone]  Psych team has evaluated the patient and he does need inpatient detox however, I have reevaluated the patient, he was redosed with CIWA of 20 x2 hours ago and needs another dose. Verbal permission given for 2 of Ativan I think this patient would benefit from inpatient detox, discussed with psych nurse practitioner, who agrees.  Discussed with Dr. Catha GosselinMikhail will admit to stepdown floor.   Wynetta Emeryicole Arneda Sappington, PA-C 09/09/15 1344  Doug SouSam Jacubowitz, MD 09/09/15 1715

## 2015-09-09 NOTE — Consult Note (Signed)
[facility] Consult   Reason for Consult:  Depression, suicidal ideations, alcohol dependence Referring Physician:  EDP Patient Identification: Christopher Reid MRN:  [id number] Principal Diagnosis: Alcohol-induced mood disorder [facility]) Diagnosis:   Patient Active Problem List   Diagnosis Date Noted  . Alcohol-induced mood disorder ([place]) [F10.94] 09/09/2015    Priority: High  . Alcohol dependence with uncomplicated withdrawal ([place]) [F10.230] 09/09/2015    Priority: High  . Panic attack [F41.0] 03/16/2015  . Alcohol abuse [F10.10] 03/16/2015    Total Time spent with patient: 45 minutes  Subjective:   Christopher Reid is a 35 y.o. male patient admitted with alcohol dependence, depression,and suicide plan.  HPI:  35 yo male who presented to the ED with alcohol dependence and depression with a plan to overdose.  He has been drinking 4 mini bottles of alcohol to a pint daily for the past five years with multiple attempts to stop on his own without success.  His father and mother-in-law are at his bedside.  He has never gotten professional help for his alcohol dependence.  He quit his very stressful job in January and regrets this.  His wife took their baby girl and left due to his drinking issues.  He is at the bottom and wants to kill himself via overdose.  Denies hallucinations and homicidal ideations.  Past Psychiatric History: alcohol dependence  Risk to Self: Suicidal Ideation: No-Not Currently/Within Last 6 Months Suicidal Intent: No-Not Currently/Within Last 6 Months Is patient at risk for suicide?: No Suicidal Plan?: No-Not Currently/Within Last 6 Months Access to Means: No What has been your use of drugs/alcohol within the last 12 months?: alcohol Intentional Self Injurious Behavior: None Risk to Others: Homicidal Ideation: No-Not Currently/Within Last 6 Months Thoughts of Harm to Others: No-Not Currently Present/Within Last 6 Months Current Homicidal  Intent: No-Not Currently/Within Last 6 Months Current Homicidal Plan: No-Not Currently/Within Last 6 Months Access to Homicidal Means: No History of harm to others?: No Assessment of Violence: None Noted Does patient have access to weapons?: No Criminal Charges Pending?: No Does patient have a court date: No Prior Inpatient Therapy: Prior Inpatient Therapy: No Prior Outpatient Therapy: Prior Outpatient Therapy: No Does patient have an ACCT team?: No Does patient have Intensive In-House Services?  : No Does patient have [facility] services? : No Does patient have P4CC services?: No  Past Medical History:  Past Medical History  Diagnosis Date  . Alcohol abuse    History reviewed. No pertinent past surgical history. Family History: No family history on file. Family Psychiatric  History: Grandfathers-alcohol abuse Social History:  History  Alcohol Use  . 0.0 oz/week  . 0 Standard drinks or equivalent per week    Comment: heavy     History  Drug Use No    Social History   Social History  . Marital Status: Single    Spouse Name: N/A  . Number of Children: N/A  . Years of Education: N/A   Social History Main Topics  . Smoking status: Current Every Day Smoker  . Smokeless tobacco: Never Used  . Alcohol Use: 0.0 oz/week    0 Standard drinks or equivalent per week     Comment: heavy  . Drug Use: No  . Sexual Activity: Not Asked   Other Topics Concern  . None   Social History Narrative   Additional Social History:    Allergies:  No Known Allergies  Labs:  Results for orders placed or performed during the hospital  encounter of 09/08/15 (from the past 48 hour(s))  Urine rapid drug screen (hosp performed) (Not at [facility])     Status: None   Collection Time: 09/08/15 11:04 PM  Result Value Ref Range   Opiates NONE DETECTED NONE DETECTED   Cocaine NONE DETECTED NONE DETECTED   Benzodiazepines NONE DETECTED NONE DETECTED   Amphetamines NONE DETECTED NONE DETECTED    Tetrahydrocannabinol NONE DETECTED NONE DETECTED   Barbiturates NONE DETECTED NONE DETECTED    Comment:        DRUG SCREEN FOR MEDICAL PURPOSES ONLY.  IF CONFIRMATION IS NEEDED FOR ANY PURPOSE, NOTIFY LAB WITHIN 5 DAYS.        LOWEST DETECTABLE LIMITS FOR URINE DRUG SCREEN Drug Class       Cutoff (ng/mL) Amphetamine      1000 Barbiturate      200 Benzodiazepine   174 Tricyclics       081 Opiates          300 Cocaine          300 THC              50   Ethanol (ETOH)     Status: Abnormal   Collection Time: 09/08/15 11:12 PM  Result Value Ref Range   Alcohol, Ethyl (B) 416 (HH) <5 mg/dL    Comment:        LOWEST DETECTABLE LIMIT FOR SERUM ALCOHOL IS 5 mg/dL FOR MEDICAL PURPOSES ONLY CRITICAL RESULT CALLED TO, READ BACK BY AND VERIFIED WITH: A.DENNIS RN AT 2350 ON 09/08/15 BY S.VANHOORNE   Comprehensive metabolic panel     Status: Abnormal   Collection Time: 09/08/15 11:12 PM  Result Value Ref Range   Sodium 143 135 - 145 mmol/L   Potassium 4.0 3.5 - 5.1 mmol/L   Chloride 104 101 - 111 mmol/L   CO2 21 (L) 22 - 32 mmol/L   Glucose, Bld 109 (H) 65 - 99 mg/dL   BUN 8 6 - 20 mg/dL   Creatinine, Ser 0.74 0.61 - 1.24 mg/dL   Calcium 9.0 8.9 - 10.3 mg/dL   Total Protein 8.2 (H) 6.5 - 8.1 g/dL   Albumin 4.5 3.5 - 5.0 g/dL   AST 111 (H) 15 - 41 U/L   ALT 130 (H) 17 - 63 U/L   Alkaline Phosphatase 61 38 - 126 U/L   Total Bilirubin 0.4 0.3 - 1.2 mg/dL   GFR calc non Af Amer >60 >60 mL/min   GFR calc Af Amer >60 >60 mL/min    Comment: (NOTE) The eGFR has been calculated using the CKD EPI equation. This calculation has not been validated in all clinical situations. eGFR's persistently <60 mL/min signify possible Chronic Kidney Disease.    Anion gap 18 (H) 5 - 15  CBC     Status: Abnormal   Collection Time: 09/08/15 11:12 PM  Result Value Ref Range   WBC 7.3 4.0 - 10.5 K/uL   RBC 5.09 4.22 - 5.81 MIL/uL   Hemoglobin 16.7 13.0 - 17.0 g/dL   HCT 47.3 39.0 - 52.0 %   MCV 92.9  78.0 - 100.0 fL   MCH 32.8 26.0 - 34.0 pg   MCHC 35.3 30.0 - 36.0 g/dL   RDW 13.4 11.5 - 15.5 %   Platelets 116 (L) 150 - 400 K/uL    Comment: SPECIMEN CHECKED FOR CLOTS REPEATED TO VERIFY PLATELET COUNT CONFIRMED BY SMEAR   Ethanol     Status: Abnormal   Collection Time:  09/09/15  2:15 AM  Result Value Ref Range   Alcohol, Ethyl (B) 324 (HH) <5 mg/dL    Comment:        LOWEST DETECTABLE LIMIT FOR SERUM ALCOHOL IS 5 mg/dL FOR MEDICAL PURPOSES ONLY CRITICAL RESULT CALLED TO, READ BACK BY AND VERIFIED WITH: DENNIS,A RN [phone] [id number] [place],N   Ethanol     Status: Abnormal   Collection Time: 09/09/15  6:50 AM  Result Value Ref Range   Alcohol, Ethyl (B) 177 (H) <5 mg/dL    Comment:        LOWEST DETECTABLE LIMIT FOR SERUM ALCOHOL IS 5 mg/dL FOR MEDICAL PURPOSES ONLY     Current Facility-Administered Medications  Medication Dose Route Frequency Provider Last Rate Last Dose  . acetaminophen (TYLENOL) tablet 650 mg  650 mg Oral Q4H PRN Nicole Pisciotta, PA-C      . alum & mag hydroxide-simeth (MAALOX/MYLANTA) 200-200-20 MG/5ML suspension 30 mL  30 mL Oral PRN Nicole Pisciotta, PA-C      . ibuprofen (ADVIL,MOTRIN) tablet 600 mg  600 mg Oral Q8H PRN Nicole Pisciotta, PA-C      . LORazepam (ATIVAN) tablet 0-4 mg  0-4 mg Oral Q6H Nicole Pisciotta, PA-C   2 mg at 09/09/15 1113   Followed by  . [START ON 09/11/2015] LORazepam (ATIVAN) tablet 0-4 mg  0-4 mg Oral Q12H Nicole Pisciotta, PA-C      . LORazepam (ATIVAN) tablet 1 mg  1 mg Oral Q8H PRN Nicole Pisciotta, PA-C      . nicotine (NICODERM CQ - dosed in mg/24 hours) patch 21 mg  21 mg Transdermal Daily Margarita Mail, PA-C   21 mg at 09/09/15 1113  . ondansetron (ZOFRAN) tablet 4 mg  4 mg Oral Q8H PRN Nicole Pisciotta, PA-C      . zolpidem (AMBIEN) tablet 5 mg  5 mg Oral QHS PRN Monico Blitz, PA-C       Current Outpatient Prescriptions  Medication Sig Dispense Refill  . chlordiazePOXIDE (LIBRIUM) 25 MG capsule Take 1 every 6  hours as needed for anxiety for 5 days.  For the first 24 hours may take 2 pills every 6 hours if needed (Patient not taking: Reported on 09/08/2015) 30 capsule 0    Musculoskeletal: Strength & Muscle Tone: within normal limits Gait & Station: normal Patient leans: N/A  Psychiatric Specialty Exam: Review of Systems  Constitutional: Negative.   HENT: Negative.   Eyes: Negative.   Respiratory: Negative.   Cardiovascular: Negative.   Gastrointestinal: Negative.   Genitourinary: Negative.   Musculoskeletal: Negative.   Skin: Negative.   Neurological: Negative.   Endo/Heme/Allergies: Negative.   Psychiatric/Behavioral: Positive for depression, suicidal ideas and substance abuse. The patient is nervous/anxious.     Blood pressure 158/110, pulse 72, temperature 98.5 F (36.9 C), temperature source Oral, resp. rate 18, SpO2 98 %.There is no weight on file to calculate BMI.  General Appearance: Disheveled  Eye Sport and exercise psychologist::  Fair  Speech:  Normal Rate  Volume:  Normal  Mood:  Anxious and Depressed  Affect:  Congruent  Thought Process:  Coherent  Orientation:  Full (Time, Place, and Person)  Thought Content:  Rumination  Suicidal Thoughts:  Yes.  with intent/plan  Homicidal Thoughts:  No  Memory:  Immediate;   Fair Recent;   Fair Remote;   Fair  Judgement:  Impaired  Insight:  Fair  Psychomotor Activity:  Decreased  Concentration:  Fair  Recall:  [employer] [place]: Fair  Akathisia:  No  Handed:  Right  AIMS (if indicated):     Assets:  Housing Leisure Time Physical Health Resilience Social Support  ADL's:  Intact  Cognition: WNL  Sleep:      Treatment Plan Summary: Daily contact with patient to assess and evaluate symptoms and progress in treatment, Medication management and Plan alcohol induced mood disorder:  -Crisis stabilization -Medication management:  Ativan alcohol detox protocol in place, gabapentin 300 mg TID for withdrawal symptoms also  started. -Individual and substance abuse couseling  Disposition: Recommend psychiatric Inpatient admission when medically cleared.  Waylan Boga, NP 09/09/2015 12:32 PM  Patient seen, chart reviewed and case discussed with the physician extender and formulated treatment plan.Reviewed the information documented and agree with the treatment plan.  Donald Jacque,JANARDHAHA R. 09/09/2015 5:10 PM

## 2015-09-09 NOTE — ED Notes (Signed)
He had become agitated and diaphoretic since prior assessment--Ativan given p.o. Per protocol.  He has a significant other, and also his parents have been here and with him for 2-3 hours now, which pt. Seems to enjoy.  He thanks us for our care.

## 2015-09-09 NOTE — H&P (Signed)
[facility] History and Physical  Christopher Reid [id number]:[id number] DOB: 05-06-1981 DOA: 09/08/2015  Referring physician: Ms. Wynetta Emery, PA EDP PCP: No primary care provider on file.  Specialists: None Patient coming from: home  Chief Complaint: Alcohol detox  HPI: Christopher Reid is a 35 y.o. male with a medical history of alcohol abuse, presented to the emergency department for alcohol detox. It seems the patient attempted to detox at home several weeks ago however started to have hallucinations at that time. Upon admission to the emergency department, alcohol level was 416. Patient was tremulous and did appear to be actively withdrawing. He was placed on 2 mg of Ativan and reassessed however patient continued to have tremors. Patient was also assessed by psychiatry in the emergency department for depression, it was recommended inpatient psychiatric treatment once medically cleared. Currently patient does complain of feeling shaky. Currently denies any chest pain, shortness of breath, dizziness, headache, hallucinations, blurry vision, nausea, vomiting, abdominal pain, changes in bowel pattern or problems urinating. Patient denies any recent illness or travel.  ED Course: Placed on CIWA protocol  Review of Systems:  As per HPI otherwise 10 point review of systems negative.   Past Medical History  Diagnosis Date  . Alcohol abuse     History reviewed. No pertinent past surgical history.  Social History:  reports that he has been smoking.  He has never used smokeless tobacco. He reports that he drinks alcohol. He reports that he does not use illicit drugs.  No Known Allergies  No family history on file. Per patient and father at bedside, no medical history of hypertension, substance abuse, diabetes.   Prior to Admission medications   Medication Sig Start Date End Date Taking? Authorizing Provider  chlordiazePOXIDE (LIBRIUM) 25 MG capsule Take 1 every 6 hours as  needed for anxiety for 5 days.  For the first 24 hours may take 2 pills every 6 hours if needed Patient not taking: Reported on 09/08/2015 03/16/15   Pearline Cables, MD    Physical Exam: Filed Vitals:   09/09/15 1100 09/09/15 1400  BP: 158/110 182/107  Pulse: 72 82  Temp: 98.5 F (36.9 C)   Resp: 18 18     General: Well developed, well nourished, NAD, appears stated age  HEENT: NCAT, PERRLA, EOMI, Anicteic Sclera, mucous membranes moist.   Neck: Supple, no JVD, no masses  Cardiovascular: S1 S2 auscultated, no rubs, murmurs or gallops. Regular rate and rhythm.  Respiratory: Clear to auscultation bilaterally  Abdomen: Soft, nontender, nondistended, + bowel sounds  Extremities: warm dry without cyanosis clubbing or edema  Neuro: AAOx3, cranial nerves grossly intact. Strength 5/5 in patient's upper and lower extremities bilaterally. Mild tremor.  Skin: Without rashes exudates or nodules  Psych: Normal affect and demeanor  Labs on Admission: I have personally reviewed following labs and imaging studies CBC:  Recent Labs Lab 09/08/15 2312  WBC 7.3  HGB 16.7  HCT 47.3  MCV 92.9  PLT 116*   Basic Metabolic Panel:  Recent Labs Lab 09/08/15 2312  NA 143  K 4.0  CL 104  CO2 21*  GLUCOSE 109*  BUN 8  CREATININE 0.74  CALCIUM 9.0   GFR: CrCl cannot be calculated (Unknown ideal weight.). Liver Function Tests:  Recent Labs Lab 09/08/15 2312  AST 111*  ALT 130*  ALKPHOS 61  BILITOT 0.4  PROT 8.2*  ALBUMIN 4.5   No results for input(s): LIPASE, AMYLASE in the last 168 hours. No results for  input(s): AMMONIA in the last 168 hours. Coagulation Profile: No results for input(s): INR, PROTIME in the last 168 hours. Cardiac Enzymes: No results for input(s): CKTOTAL, CKMB, CKMBINDEX, TROPONINI in the last 168 hours. BNP (last 3 results) No results for input(s): PROBNP in the last 8760 hours. HbA1C: No results for input(s): HGBA1C in the last 72  hours. CBG: No results for input(s): GLUCAP in the last 168 hours. Lipid Profile: No results for input(s): CHOL, HDL, LDLCALC, TRIG, CHOLHDL, LDLDIRECT in the last 72 hours. Thyroid Function Tests: No results for input(s): TSH, T4TOTAL, FREET4, T3FREE, THYROIDAB in the last 72 hours. Anemia Panel: No results for input(s): VITAMINB12, FOLATE, FERRITIN, TIBC, IRON, RETICCTPCT in the last 72 hours. Urine analysis: No results found for: COLORURINE, APPEARANCEUR, LABSPEC, PHURINE, GLUCOSEU, HGBUR, BILIRUBINUR, KETONESUR, PROTEINUR, UROBILINOGEN, NITRITE, LEUKOCYTESUR Sepsis Labs: @LABRCNTIP (procalcitonin:4,lacticidven:4) )No results found for this or any previous visit (from the past 240 hour(s)).   Radiological Exams on Admission: No results found.  EKG: None  Assessment/Plan  Alcohol dependence and withdrawal -Alcohol level upon admission was 416, trending downward to 177 -Patient currently having tremors, Ativan given in the ED not controlling symptoms -Will place on CIWA protocol, banana bag, followed by multivitamin, folate, thiamine  Tobacco abuse -Discussed cessation -Ordered nicotine patch  Elevated LFTs -Likely secondary to alcohol abuse -Will give IV fluids and continue to monitor CMP  Hypertension -Situational, hydralazine as needed  Depression -Psychiatry consult will patient was in the emergency department. Recommended inpatient psychiatric admission once patient is medically cleared  DVT prophylaxis: Lovenox  Code Status: Full  Family Communication: Dad at bedside. Admission, patients condition and plan of care including tests being ordered have been discussed with the patient and dad who indicate understanding and agree with the plan and Code Status.  Disposition Plan: Admitted to stepdown.   Consults called: None   Admission status: inpatient   Time spent: 60 minutes  Gracilyn Gunia D.O. [facility] Pager [phone]  If 7PM-7AM, please  contact night-coverage [email or web address] Password [email or web address] 09/09/2015, 2:08 PM

## 2015-09-09 NOTE — ED Notes (Signed)
Our TTS Counsellor is seeing him as I write this.

## 2015-09-09 NOTE — ED Provider Notes (Signed)
Patient wishes stop drinking. He presently appears tremulous. He is alert and appropriate after treatment with Ativan  Doug SouSam Finnley [place], MD 09/09/15 1333

## 2015-09-10 DIAGNOSIS — Z720 Tobacco use: Secondary | ICD-10-CM

## 2015-09-10 DIAGNOSIS — F10239 Alcohol dependence with withdrawal, unspecified: Principal | ICD-10-CM

## 2015-09-10 DIAGNOSIS — F10230 Alcohol dependence with withdrawal, uncomplicated: Secondary | ICD-10-CM

## 2015-09-10 LAB — COMPREHENSIVE METABOLIC PANEL
ALT: 100 U/L — AB (ref 17–63)
AST: 89 U/L — AB (ref 15–41)
Albumin: 3.9 g/dL (ref 3.5–5.0)
Alkaline Phosphatase: 50 U/L (ref 38–126)
Anion gap: 12 (ref 5–15)
BILIRUBIN TOTAL: 1 mg/dL (ref 0.3–1.2)
BUN: 8 mg/dL (ref 6–20)
CALCIUM: 9.1 mg/dL (ref 8.9–10.3)
CO2: 26 mmol/L (ref 22–32)
CREATININE: 0.66 mg/dL (ref 0.61–1.24)
Chloride: 102 mmol/L (ref 101–111)
GFR calc Af Amer: >60 mL/min (ref >60)
Glucose, Bld: 87 mg/dL (ref 65–99)
Potassium: 3.7 mmol/L (ref 3.5–5.1)
Sodium: 140 mmol/L (ref 135–145)
TOTAL PROTEIN: 6.9 g/dL (ref 6.5–8.1)

## 2015-09-10 LAB — CBC
HCT: 40.8 % (ref 39.0–52.0)
HEMOGLOBIN: 14.1 g/dL (ref 13.0–17.0)
MCH: 32.6 pg (ref 26.0–34.0)
MCHC: 34.6 g/dL (ref 30.0–36.0)
MCV: 94.2 fL (ref 78.0–100.0)
PLATELETS: 90 10*3/uL — AB (ref 150–400)
RBC: 4.33 MIL/uL (ref 4.22–5.81)
RDW: 13.2 % (ref 11.5–15.5)
WBC: 5.5 10*3/uL (ref 4.0–10.5)

## 2015-09-10 MED ORDER — AMLODIPINE BESYLATE 10 MG PO TABS
10.0000 mg | ORAL_TABLET | Freq: Every day | ORAL | Status: DC
  Administered 2015-09-10 – 2015-09-12 (×3): 10 mg via ORAL
  Filled 2015-09-10 (×4): qty 1

## 2015-09-10 NOTE — Progress Notes (Addendum)
PROGRESS NOTE    Christopher Reid  ZOX:[id number] DOB: 03/04/1981 DOA: 09/08/2015 PCP: No primary care provider on file. Outpatient Specialists:  Brief Narrative: : Christopher Reid is a 35 y.o. male with a medical history of alcohol abuse, presented to the emergency department for alcohol detox. It seems the patient attempted to detox at home several weeks ago however started to have hallucinations at that time. Upon admission to the emergency department, alcohol level was 416. Patient was tremulous and did appear to be actively withdrawing. Psych consulted and recommended Inpatient Psych  Assessment & Plan:   Alcohol dependence and withdrawal -Alcohol level upon admission was 416 -continue Ativan per CIWA protocol -last CIWA was 13, continue IVF, multivitamin, folate, thiamine -Tx to floor/tele  Tobacco abuse -Discussed cessation, continue nicotine patch  Elevated LFTs -Likely secondary to ETOh hepatitis although AST/ALT pattern not typical of ETOH -monitor  Hypertension -Add amlodipine, hydralazine PRN  Depression/Substance abuse -Psychiatry consulted, recommended inpatient psychiatric admission once patient is medically cleared  DVT prophylaxis: change to SCDs due to low plts  Code Status: Full Family Communication: no family at bedside Disposition Plan: Tx to floor Consultants: Psych Dr.Jonalaggada   Procedures:  Antimicrobials:   Subjective: Feels ok, some shakes and tremors  Objective: Filed Vitals:   09/10/15 0440 09/10/15 0500 09/10/15 0600 09/10/15 0800  BP: 171/99 152/96 144/91   Pulse:      Temp:    98.2 F (36.8 C)  TempSrc:    Oral  Resp: 19 19 19    Height:      Weight:  101.6 kg (223 lb 15.8 oz)    SpO2: 99% 97% 98%     Intake/Output Summary (Last 24 hours) at 09/10/15 1035 Last data filed at 09/10/15 0700  Gross per 24 hour  Intake 1937.5 ml  Output   1825 ml  Net  112.5 ml   Filed Weights   09/09/15 1501 09/10/15 0500    Weight: 100.8 kg (222 lb 3.6 oz) 101.6 kg (223 lb 15.8 oz)    Examination:  General exam: Appears comfortable, mild tremors Respiratory system: Clear to auscultation. Respiratory effort normal. Cardiovascular system: S1 & S2 heard, RRR. No JVD, murmurs, rubs, gallops or clicks. No pedal edema. Gastrointestinal system: Abdomen is nondistended, soft and nontender. No organomegaly or masses felt. Normal bowel sounds. Central nervous system: restless, tremulous, no asterixes Extremities: Symmetric 5 x 5 power. Skin: No rashes, lesions or ulcers    Data Reviewed: I have personally reviewed following labs and imaging studies  CBC:  Recent Labs Lab 09/08/15 2312 09/10/15 0318  WBC 7.3 5.5  HGB 16.7 14.1  HCT 47.3 40.8  MCV 92.9 94.2  PLT 116* 90*   Basic Metabolic Panel:  Recent Labs Lab 09/08/15 2312 09/10/15 0318  NA 143 140  K 4.0 3.7  CL 104 102  CO2 21* 26  GLUCOSE 109* 87  BUN 8 8  CREATININE 0.74 0.66  CALCIUM 9.0 9.1   GFR: Estimated Creatinine Clearance: 156.7 mL/min (by C-G formula based on Cr of 0.66). Liver Function Tests:  Recent Labs Lab 09/08/15 2312 09/10/15 0318  AST 111* 89*  ALT 130* 100*  ALKPHOS 61 50  BILITOT 0.4 1.0  PROT 8.2* 6.9  ALBUMIN 4.5 3.9   No results for input(s): LIPASE, AMYLASE in the last 168 hours. No results for input(s): AMMONIA in the last 168 hours. Coagulation Profile: No results for input(s): INR, PROTIME in the last 168 hours. Cardiac Enzymes: No results for  input(s): CKTOTAL, CKMB, CKMBINDEX, TROPONINI in the last 168 hours. BNP (last 3 results) No results for input(s): PROBNP in the last 8760 hours. HbA1C: No results for input(s): HGBA1C in the last 72 hours. CBG: No results for input(s): GLUCAP in the last 168 hours. Lipid Profile: No results for input(s): CHOL, HDL, LDLCALC, TRIG, CHOLHDL, LDLDIRECT in the last 72 hours. Thyroid Function Tests: No results for input(s): TSH, T4TOTAL, FREET4, T3FREE,  THYROIDAB in the last 72 hours. Anemia Panel: No results for input(s): VITAMINB12, FOLATE, FERRITIN, TIBC, IRON, RETICCTPCT in the last 72 hours. Urine analysis: No results found for: COLORURINE, APPEARANCEUR, LABSPEC, PHURINE, GLUCOSEU, HGBUR, BILIRUBINUR, KETONESUR, PROTEINUR, UROBILINOGEN, NITRITE, LEUKOCYTESUR Sepsis Labs: (procalcitonin:4,lacticidven:4)  ) Recent Results (from the past 240 hour(s))  MRSA PCR Screening     Status: None   Collection Time: 09/09/15  3:00 PM  Result Value Ref Range Status   MRSA by PCR NEGATIVE NEGATIVE Final    Comment:        The GeneXpert MRSA Assay (FDA approved for NASAL specimens only), is one component of a comprehensive MRSA colonization surveillance program. It is not intended to diagnose MRSA infection nor to guide or monitor treatment for MRSA infections.          Radiology Studies: No results found.      Scheduled Meds: . amLODipine  10 mg Oral Daily  . enoxaparin (LOVENOX) injection  40 mg Subcutaneous Q24H  . folic acid  1 mg Oral Daily  . gabapentin  300 mg Oral TID  . multivitamin with minerals  1 tablet Oral Daily  . nicotine  21 mg Transdermal Daily  . thiamine  100 mg Oral Daily   Continuous Infusions: . sodium chloride 75 mL/hr at 09/10/15 0741     LOS: 1 day    Time spent:    Zannie Cove, MD [facility] Pager [phone]  If 7PM-7AM, please contact night-coverage [email or web address] Password [facility] 09/10/2015, 10:35 AM

## 2015-09-10 NOTE — Progress Notes (Signed)
Utilization review completed.  

## 2015-09-11 DIAGNOSIS — R7989 Other specified abnormal findings of blood chemistry: Secondary | ICD-10-CM

## 2015-09-11 LAB — COMPREHENSIVE METABOLIC PANEL
ALK PHOS: 54 U/L (ref 38–126)
ALT: 98 U/L — ABNORMAL HIGH (ref 17–63)
ANION GAP: 11 (ref 5–15)
AST: 79 U/L — ABNORMAL HIGH (ref 15–41)
Albumin: 3.8 g/dL (ref 3.5–5.0)
BILIRUBIN TOTAL: 1 mg/dL (ref 0.3–1.2)
BUN: 8 mg/dL (ref 6–20)
CALCIUM: 9.3 mg/dL (ref 8.9–10.3)
CO2: 23 mmol/L (ref 22–32)
Chloride: 102 mmol/L (ref 101–111)
Creatinine, Ser: 0.79 mg/dL (ref 0.61–1.24)
Glucose, Bld: 87 mg/dL (ref 65–99)
Potassium: 3.8 mmol/L (ref 3.5–5.1)
Sodium: 136 mmol/L (ref 135–145)
TOTAL PROTEIN: 7 g/dL (ref 6.5–8.1)

## 2015-09-11 LAB — CBC
HEMATOCRIT: 43.9 % (ref 39.0–52.0)
HEMOGLOBIN: 15.2 g/dL (ref 13.0–17.0)
MCH: 32.6 pg (ref 26.0–34.0)
MCHC: 34.6 g/dL (ref 30.0–36.0)
MCV: 94.2 fL (ref 78.0–100.0)
Platelets: 104 10*3/uL — ABNORMAL LOW (ref 150–400)
RBC: 4.66 MIL/uL (ref 4.22–5.81)
RDW: 13.2 % (ref 11.5–15.5)
WBC: 5.1 10*3/uL (ref 4.0–10.5)

## 2015-09-11 NOTE — Progress Notes (Addendum)
LCSW following patient for disposition: right now recommendation is inpatient psychiatric for SA and MH. Patient has long history of substance abuse specifically alcohol.    Patient does not have medical clearance per chart review at this time. Once patient is medically cleared, referrals will be faxed out for placement or if recommendation changes, LCSW will adjust disposition and referrals.  LCSW following.  Please see full psychosocial assessment.  Patient currently: under voluntary status.  Deretha EmoryHannah Yvett Rossel LCSW, MSW Clinical Social Work: [employer] [phone]

## 2015-09-11 NOTE — Progress Notes (Signed)
This shift pt arrived to unit room 1517 in wheelchair. Alert and Oriented x4. VS taken, pt oriented to room and callbell with no complications. Gait unsteady, general weakness 0/10 pain. Initial assessment completed. Will continue to monitor pt and intervene accordingly.

## 2015-09-11 NOTE — Progress Notes (Signed)
PROGRESS NOTE    Christopher Reid  ZOX:[id number] DOB: 1980-11-16 DOA: 09/08/2015 PCP: No primary care provider on file. Outpatient Specialists:  Brief Narrative: : Christopher Reid is a 35 y.o. male with a medical history of alcohol abuse, presented to the emergency department for alcohol detox. It seems the patient attempted to detox at home several weeks ago however started to have hallucinations at that time. Upon admission to the emergency department, alcohol level was 416. Patient was tremulous and did appear to be actively withdrawing. Psych consulted and recommended Inpatient Psych  Assessment & Plan:   Alcohol dependence and withdrawal -Alcohol level upon admission was 416 -continue Ativan per CIWA protocol -last CIWA was 13, continue IVF, multivitamin, folate, thiamine -still having symptoms, tachycardia, tremulousness, diaphoresis  Tobacco abuse -Discussed cessation, continue nicotine patch  Elevated LFTs -Likely secondary to ETOh hepatitis although AST/ALT pattern not typical of ETOH -improving  Hypertension -continue amlodipine, hydralazine PRN  Depression/Substance abuse -Psychiatry consulted, recommended inpatient psychiatric admission once patient is medically cleared  DVT prophylaxis: changed to SCDs due to low plts  Code Status: Full Family Communication: no family at bedside Disposition Plan: Tx to [facility] in 1-2days Consultants: Psych Dr.Jonalaggada   Procedures:  Antimicrobials:   Subjective: Feels ok, some shakes and tremors, HR jumping up to 120s-140s  Objective: Filed Vitals:   09/10/15 2048 09/10/15 2356 09/11/15 0500 09/11/15 1047  BP: 161/94 151/91  153/95  Pulse: 69 82  85  Temp: 98.3 F (36.8 C) 98 F (36.7 C)  97.8 F (36.6 C)  TempSrc: Oral Oral  Oral  Resp: Height:      Weight:   99.5 kg (219 lb 5.7 oz)   SpO2: 100% 100%  100%    Intake/Output Summary (Last 24 hours) at 09/11/15 1127 Last data filed at  09/11/15 1037  Gross per 24 hour  Intake 1264.17 ml  Output   2125 ml  Net -860.83 ml   Filed Weights   09/09/15 1501 09/10/15 0500 09/11/15 0500  Weight: 100.8 kg (222 lb 3.6 oz) 101.6 kg (223 lb 15.8 oz) 99.5 kg (219 lb 5.7 oz)    Examination:  General exam: Appears anxious, mild tremors Respiratory system: Clear to auscultation. Respiratory effort normal. Cardiovascular system: S1 & S2 heard, RRR. No JVD, murmurs, rubs, gallops or clicks. No pedal edema. Gastrointestinal system: Abdomen is nondistended, soft and nontender. No organomegaly or masses felt. Normal bowel sounds. Central nervous system: restless, tremulous, no asterixes Extremities: Symmetric 5 x 5 power. Skin: No rashes, lesions or ulcers    Data Reviewed: I have personally reviewed following labs and imaging studies  CBC:  Recent Labs Lab 09/08/15 2312 09/10/15 0318 09/11/15 0531  WBC 7.3 5.5 5.1  HGB 16.7 14.1 15.2  HCT 47.3 40.8 43.9  MCV 92.9 94.2 94.2  PLT 116* 90* 104*   Basic Metabolic Panel:  Recent Labs Lab 09/08/15 2312 09/10/15 0318 09/11/15 0531  NA 143 140 136  K 4.0 3.7 3.8  CL 104 102 102  CO2 21* 26 23  GLUCOSE 109* 87 87  BUN CREATININE 0.74 0.66 0.79  CALCIUM 9.0 9.1 9.3   GFR: Estimated Creatinine Clearance: 156.7 mL/min (by C-G formula based on Cr of 0.79). Liver Function Tests:  Recent Labs Lab 09/08/15 2312 09/10/15 0318 09/11/15 0531  AST 111* 89* 79*  ALT 130* 100* 98*  ALKPHOS 61 50 54  BILITOT 0.4 1.0 1.0  PROT 8.2* 6.9  7.0  ALBUMIN 4.5 3.9 3.8   No results for input(s): LIPASE, AMYLASE in the last 168 hours. No results for input(s): AMMONIA in the last 168 hours. Coagulation Profile: No results for input(s): INR, PROTIME in the last 168 hours. Cardiac Enzymes: No results for input(s): CKTOTAL, CKMB, CKMBINDEX, TROPONINI in the last 168 hours. BNP (last 3 results) No results for input(s): PROBNP in the last 8760 hours. HbA1C: No results for  input(s): HGBA1C in the last 72 hours. CBG: No results for input(s): GLUCAP in the last 168 hours. Lipid Profile: No results for input(s): CHOL, HDL, LDLCALC, TRIG, CHOLHDL, LDLDIRECT in the last 72 hours. Thyroid Function Tests: No results for input(s): TSH, T4TOTAL, FREET4, T3FREE, THYROIDAB in the last 72 hours. Anemia Panel: No results for input(s): VITAMINB12, FOLATE, FERRITIN, TIBC, IRON, RETICCTPCT in the last 72 hours. Urine analysis: No results found for: COLORURINE, APPEARANCEUR, LABSPEC, PHURINE, GLUCOSEU, HGBUR, BILIRUBINUR, KETONESUR, PROTEINUR, UROBILINOGEN, NITRITE, LEUKOCYTESUR Sepsis Labs: @LABRCNTIP (procalcitonin:4,lacticidven:4)  ) Recent Results (from the past 240 hour(s))  MRSA PCR Screening     Status: None   Collection Time: 09/09/15  3:00 PM  Result Value Ref Range Status   MRSA by PCR NEGATIVE NEGATIVE Final    Comment:        The GeneXpert MRSA Assay (FDA approved for NASAL specimens only), is one component of a comprehensive MRSA colonization surveillance program. It is not intended to diagnose MRSA infection nor to guide or monitor treatment for MRSA infections.          Radiology Studies: No results found.      Scheduled Meds: . amLODipine  10 mg Oral Daily  . folic acid  1 mg Oral Daily  . gabapentin  300 mg Oral TID  . multivitamin with minerals  1 tablet Oral Daily  . nicotine  21 mg Transdermal Daily  . thiamine  100 mg Oral Daily   Continuous Infusions: . sodium chloride 75 mL/hr at 09/11/15 1040     LOS: 2 days    Time spent: 35min    Zannie CovePreetha Jerl Munyan, MD [facility] Pager [phone]  If 7PM-7AM, please contact night-coverage [email or web address] Password [email or web address] 09/11/2015, 11:27 AM

## 2015-09-11 NOTE — Consult Note (Signed)
[facility] Face-to-Face Psychiatry Consult  Patient Identification: Christopher Reid MRN:  [id number] Principal Diagnosis: Alcohol-induced mood disorder [facility]) Diagnosis:   Patient Active Problem List   Diagnosis Date Noted  . Alcohol-induced mood disorder ([place]) [F10.94] 09/09/2015  . Alcohol dependence with uncomplicated withdrawal ([place]) [F10.230] 09/09/2015  . Alcohol withdrawal ([place]) [F10.239] 09/09/2015  . Tobacco abuse [Z72.0] 09/09/2015  . Depression [F32.9] 09/09/2015  . Elevated LFTs [R79.89] 09/09/2015  . Hypertension [I10] 09/09/2015  . Panic attack [F41.0] 03/16/2015  . Alcohol abuse [F10.10] 03/16/2015    Total Time spent with patient: 45 minutes  Subjective:   Christopher Reid is a 35 y.o. male patient admitted with alcohol dependence, depression,and suicide plan.  HPI: Christopher Reid is a 35 years old male admitted to [facility] bed for alcohol dependence with alcohol withdrawal symptoms. Patient required supportive therapy, hydration therapy and benzodiazepine withdrawal therapy. Patient reportedly suffering with substance induced depression and also has a passive suicidal thoughts and had arrival to emergency department. Patient seems to be more awake, alert, oriented to time place person and situation and requested discharge to home and follow-up with outpatient medication management for depression and alcohol dependence. Patient denies current symptoms of acute withdrawal symptoms, depression, anxiety, suicidal/homicidal ideation, intention or plans. Patient has no evidence of psychosis or withdrawal seizures. Patient has been supported by multiple family members. Patient reportedly want to go back to work so that he can pay his bills. His father and mother-in-law who came to the emergency department seems to be very supportive to him, patient reported the event back to their home in [place]. He has been drinking 4 mini bottles of alcohol to a pint of   workup daily for the past five years with multiple attempts to stop on his own without success.  Patient reportedly has no previous history of alcohol detox treatment or rehabilitation services. Patient regrets for losing his job and his family when he was drinking heavily. Patient labs indicated elevated liver enzymes and a blood alcohol level is extremely high in the emergency department.  Past Psychiatric History: Alcohol dependence with intoxication  Risk to Self: Suicidal Ideation: No-Not Currently/Within Last 6 Months Suicidal Intent: No-Not Currently/Within Last 6 Months Is patient at risk for suicide?: No Suicidal Plan?: No-Not Currently/Within Last 6 Months Access to Means: No What has been your use of drugs/alcohol within the last 12 months?: alcohol Intentional Self Injurious Behavior: None Risk to Others: Homicidal Ideation: No-Not Currently/Within Last 6 Months Thoughts of Harm to Others: No-Not Currently Present/Within Last 6 Months Current Homicidal Intent: No-Not Currently/Within Last 6 Months Current Homicidal Plan: No-Not Currently/Within Last 6 Months Access to Homicidal Means: No History of harm to others?: No Assessment of Violence: None Noted Does patient have access to weapons?: No Criminal Charges Pending?: No Does patient have a court date: No Prior Inpatient Therapy: Prior Inpatient Therapy: No Prior Outpatient Therapy: Prior Outpatient Therapy: No Does patient have an ACCT team?: No Does patient have Intensive In-House Services?  : No Does patient have [facility] services? : No Does patient have P4CC services?: No  Past Medical History:  Past Medical History  Diagnosis Date  . Alcohol abuse    History reviewed. No pertinent past surgical history. Family History: No family history on file. Family Psychiatric  History: Grandfathers-alcohol abuse Social History:  History  Alcohol Use  . 0.0 oz/week  . 0 Standard drinks or equivalent per week    Comment:  heavy  History  Drug Use No    Social History   Social History  . Marital Status: Single    Spouse Name: N/A  . Number of Children: N/A  . Years of Education: N/A   Social History Main Topics  . Smoking status: Current Every Day Smoker  . Smokeless tobacco: Never Used  . Alcohol Use: 0.0 oz/week    0 Standard drinks or equivalent per week     Comment: heavy  . Drug Use: No  . Sexual Activity: Not Asked   Other Topics Concern  . None   Social History Narrative   Additional Social History:    Allergies:  No Known Allergies  Labs:  Results for orders placed or performed during the hospital encounter of 09/08/15 (from the past 48 hour(s))  Comprehensive metabolic panel     Status: Abnormal   Collection Time: 09/10/15  3:18 AM  Result Value Ref Range   Sodium 140 135 - 145 mmol/L   Potassium 3.7 3.5 - 5.1 mmol/L   Chloride 102 101 - 111 mmol/L   CO2 26 22 - 32 mmol/L   Glucose, Bld 87 65 - 99 mg/dL   BUN 8 6 - 20 mg/dL   Creatinine, Ser 0.66 0.61 - 1.24 mg/dL   Calcium 9.1 8.9 - 10.3 mg/dL   Total Protein 6.9 6.5 - 8.1 g/dL   Albumin 3.9 3.5 - 5.0 g/dL   AST 89 (H) 15 - 41 U/L   ALT 100 (H) 17 - 63 U/L   Alkaline Phosphatase 50 38 - 126 U/L   Total Bilirubin 1.0 0.3 - 1.2 mg/dL   GFR calc non Af Amer >60 >60 mL/min   GFR calc Af Amer >60 >60 mL/min    Comment: (NOTE) The eGFR has been calculated using the CKD EPI equation. This calculation has not been validated in all clinical situations. eGFR's persistently <60 mL/min signify possible Chronic Kidney Disease.    Anion gap 12 5 - 15  CBC     Status: Abnormal   Collection Time: 09/10/15  3:18 AM  Result Value Ref Range   WBC 5.5 4.0 - 10.5 K/uL   RBC 4.33 4.22 - 5.81 MIL/uL   Hemoglobin 14.1 13.0 - 17.0 g/dL   HCT 40.8 39.0 - 52.0 %   MCV 94.2 78.0 - 100.0 fL   MCH 32.6 26.0 - 34.0 pg   MCHC 34.6 30.0 - 36.0 g/dL   RDW 13.2 11.5 - 15.5 %   Platelets 90 (L) 150 - 400 K/uL    Comment: CONSISTENT WITH  PREVIOUS RESULT  CBC     Status: Abnormal   Collection Time: 09/11/15  5:31 AM  Result Value Ref Range   WBC 5.1 4.0 - 10.5 K/uL   RBC 4.66 4.22 - 5.81 MIL/uL   Hemoglobin 15.2 13.0 - 17.0 g/dL   HCT 43.9 39.0 - 52.0 %   MCV 94.2 78.0 - 100.0 fL   MCH 32.6 26.0 - 34.0 pg   MCHC 34.6 30.0 - 36.0 g/dL   RDW 13.2 11.5 - 15.5 %   Platelets 104 (L) 150 - 400 K/uL    Comment: CONSISTENT WITH PREVIOUS RESULT  Comprehensive metabolic panel     Status: Abnormal   Collection Time: 09/11/15  5:31 AM  Result Value Ref Range   Sodium 136 135 - 145 mmol/L   Potassium 3.8 3.5 - 5.1 mmol/L   Chloride 102 101 - 111 mmol/L   CO2 23 22 - 32 mmol/L  Glucose, Bld 87 65 - 99 mg/dL   BUN 8 6 - 20 mg/dL   Creatinine, Ser 0.79 0.61 - 1.24 mg/dL   Calcium 9.3 8.9 - 10.3 mg/dL   Total Protein 7.0 6.5 - 8.1 g/dL   Albumin 3.8 3.5 - 5.0 g/dL   AST 79 (H) 15 - 41 U/L   ALT 98 (H) 17 - 63 U/L   Alkaline Phosphatase 54 38 - 126 U/L   Total Bilirubin 1.0 0.3 - 1.2 mg/dL   GFR calc non Af Amer >60 >60 mL/min   GFR calc Af Amer >60 >60 mL/min    Comment: (NOTE) The eGFR has been calculated using the CKD EPI equation. This calculation has not been validated in all clinical situations. eGFR's persistently <60 mL/min signify possible Chronic Kidney Disease.    Anion gap 11 5 - 15    Current Facility-Administered Medications  Medication Dose Route Frequency Provider Last Rate Last Dose  . 0.9 %  sodium chloride infusion   Intravenous Continuous Domenic Polite, MD 75 mL/hr at 09/11/15 1040    . acetaminophen (TYLENOL) tablet 650 mg  650 mg Oral Q6H PRN Maryann Mikhail, DO   650 mg at 09/10/15 1008   Or  . acetaminophen (TYLENOL) suppository 650 mg  650 mg Rectal Q6H PRN Maryann Mikhail, DO      . alum & mag hydroxide-simeth (MAALOX/MYLANTA) 200-200-20 MG/5ML suspension 30 mL  30 mL Oral PRN Nicole Pisciotta, PA-C      . amLODipine (NORVASC) tablet 10 mg  10 mg Oral Daily Domenic Polite, MD   10 mg at  09/11/15 1040  . folic acid (FOLVITE) tablet 1 mg  1 mg Oral Daily Maryann Mikhail, DO   1 mg at 09/11/15 1040  . gabapentin (NEURONTIN) capsule 300 mg  300 mg Oral TID Patrecia Pour, NP   300 mg at 09/11/15 1040  . hydrALAZINE (APRESOLINE) injection 10 mg  10 mg Intravenous Q6H PRN Maryann Mikhail, DO   10 mg at 09/10/15 2212  . ibuprofen (ADVIL,MOTRIN) tablet 600 mg  600 mg Oral Q8H PRN [employer], PA-C      . LORazepam (ATIVAN) injection 2-3 mg  2-3 mg Intravenous Q1H PRN Maryann Mikhail, DO   2 mg at 09/11/15 1040  . multivitamin with minerals tablet 1 tablet  1 tablet Oral Daily Maryann Mikhail, DO   1 tablet at 09/11/15 1040  . nicotine (NICODERM CQ - dosed in mg/24 hours) patch 21 mg  21 mg Transdermal Daily Margarita Mail, PA-C   21 mg at 09/11/15 1050  . ondansetron (ZOFRAN) tablet 4 mg  4 mg Oral Q6H PRN Maryann Mikhail, DO       Or  . ondansetron (ZOFRAN) injection 4 mg  4 mg Intravenous Q6H PRN Maryann Mikhail, DO      . thiamine (VITAMIN B-1) tablet 100 mg  100 mg Oral Daily Maryann Mikhail, DO   100 mg at 09/11/15 1040  . zolpidem (AMBIEN) tablet 5 mg  5 mg Oral QHS PRN Monico Blitz, PA-C   5 mg at 09/10/15 2212    Musculoskeletal: Strength & Muscle Tone: within normal limits Gait & Station: normal Patient leans: N/A  Psychiatric Specialty Exam: Review of Systems: generalized weakness, depression and anxiety being in hospital not able to go back to work and pay his bills. Patient seems like a limited insight, judgment and impulse control. No Fever-chills, No Headache, No changes with Vision or hearing, reports vertigo No problems swallowing food  or Liquids, No Chest pain, Cough or Shortness of Breath, No Abdominal pain, No Nausea or Vommitting, Bowel movements are regular, No Blood in stool or Urine, No dysuria, No new skin rashes or bruises, No new joints pains-aches,  No new weakness, tingling, numbness in any extremity, No recent weight gain or loss, No  polyuria, polydypsia or polyphagia,   A full 10 point Review of Systems was done, except as stated above, all other Review of Systems were negative.   Blood pressure 134/91, pulse 97, temperature 98.1 F (36.7 C), temperature source Oral, resp. rate 18, height '6\' 4"'  (1.93 m), weight 99.5 kg (219 lb 5.7 oz), SpO2 100 %.Body mass index is 26.71 kg/(m^2).  General Appearance: Disheveled  Eye Sport and exercise psychologist::  Fair  Speech:  Normal Rate  Volume:  Normal  Mood:  Anxious and Depressed  Affect:  Congruent  Thought Process:  Coherent  Orientation:  Full (Time, Place, and Person)  Thought Content:  Rumination  Suicidal Thoughts:  No  Homicidal Thoughts:  No  Memory:  Immediate;   Fair Recent;   Fair Remote;   Fair  Judgement:  Impaired  Insight:  Fair  Psychomotor Activity:  Decreased  Concentration:  Fair  Recall:  [employer] of Knowledge:Fair  Language: Fair  Akathisia:  No  Handed:  Right  AIMS (if indicated):     Assets:  Housing Leisure Time Physical Health Resilience Social Support  ADL's:  Intact  Cognition: WNL  Sleep:      Treatment Plan Summary: Daily contact with patient to assess and evaluate symptoms and progress in treatment, Medication management and Plan alcohol induced mood disorder:  Case discussed with psychiatric LCSW on the unit Patient has no safety concerns as patient contract for safety while in the hospital Patient will continue Ativan detox treatment as planned and supportive therapy Continue Neurontin 300 mg 3 times daily Patient will participate in chemical dependency intensive outpatient program or partial hospitalization when medically cleared Appreciate psychiatric consultation and we sign off as of today Please contact [phone] or [phone] if needs further assistance  Patient does not meet criteria for psychiatric inpatient admission. Supportive therapy provided about ongoing stressors. Refer to IOP. Chemical dependency or partial  hospitalization  Alegria Dominique,JANARDHAHA R. 09/11/2015 3:03 PM
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
## 2015-09-12 DIAGNOSIS — F1010 Alcohol abuse, uncomplicated: Secondary | ICD-10-CM

## 2015-09-12 MED ORDER — THIAMINE HCL 100 MG PO TABS: 100.0000 mg | ORAL_TABLET | Freq: Every day | ORAL | Status: DC

## 2015-09-12 MED ORDER — NICOTINE 21 MG/24HR TD PT24: 21.0000 mg | MEDICATED_PATCH | Freq: Every day | TRANSDERMAL | Status: DC

## 2015-09-12 NOTE — Progress Notes (Addendum)
LCSW has reviewed case and notes per MD with Psych. Patient being recommended for CD-IOP or outpatient therapy at discharge. Patient does not meet inpatient criteria.   Plan:  Appointment:  April 27th at 4:30pm for assessment: CDIOP with Charmian MuffAnn Evans. Group will begin on Monday May 1st. ([facility] CD IOP) DC home. [facility] and message left for Charmian MuffAnn Evans who is Tree surgeonprogram director.   [facility] also called to get information about CD IOP program as well and LCSW will give both options to patient prior to DC.  IF patient willing and signs consents, LCSW will send referral to expidite outpatient plan and solidify he will get into program. Patient has a right to refuse and call on his own.  LCSW following and will meet with patient once [facility].  Deretha EmoryHannah Granvel Proudfoot LCSW, MSW Clinical Social Work: [facility] [employer] [phone]

## 2015-09-12 NOTE — Discharge Summary (Signed)
Physician Discharge Summary  Christopher Reid ZOX:[id number] DOB: [birth year] DOA: 09/08/2015  PCP: No primary care provider on file.  Admit date: 09/08/2015 Discharge date: 09/12/2015  Time spent: 45 minutes  Recommendations for Outpatient Follow-up:  Appointment: April 27th at 4:30pm for assessment: CDIOP with Charmian MuffAnn Evans. Group will begin on Monday May 1st. [facility](BH CD IOP)   Discharge Diagnoses:  Principal Problem:   Alcohol-induced mood disorder (HCC) Active Problems:   Alcohol dependence with uncomplicated withdrawal (HCC)   Alcohol withdrawal (HCC)   Tobacco abuse   Depression   Elevated LFTs   Hypertension   Discharge Condition: stable  Diet recommendation: heart healthy  Filed Weights   09/09/15 1501 09/10/15 0500 09/11/15 0500  Weight: 100.8 kg (222 lb 3.6 oz) 101.6 kg (223 lb 15.8 oz) 99.5 kg (219 lb 5.7 oz)    History of present illness:  Christopher Reid is a 35 y.o. male with a medical history of alcohol abuse, presented to the emergency department for alcohol detox. It seems the patient attempted to detox at home several weeks ago however started to have hallucinations at that time. Upon admission to the emergency department, alcohol level was 416. Patient was tremulous and did appear to be actively withdrawing  Hospital Course:  Alcohol dependence and withdrawal -Alcohol level upon admission was 416 -treated with Ativan per CIWA protocol -symptomatically improved and stable now -discharged home with plan for intensive Outpatient therapy for Psych FU  Tobacco abuse -cessation counseled, continue nicotine patch  Elevated LFTs -Likely secondary to ETOh hepatitis although AST/ALT pattern not typical of ETOH -improved  High BP -due to ETOH withdrawal, treated with amlodipine, hydralazine PRN -now improved  Depression/Substance abuse -Psychiatry consulted, recommended initially recommended inpatient psychiatric admission subsequently felt he would be  appropriate for intensive outpatient therapy and set this up for him prior to discharge. -Appointment: April 27th at 4:30pm for assessment: CDIOP with Charmian MuffAnn Evans. Group will begin on Monday May 1st. [facility](BH CD IOP)  Procedures:  Consultations:  Psychiatry  Social work  Discharge Exam: Filed Vitals:   09/11/15 2104 09/12/15 0548  BP: 129/82 146/88  Pulse: 87 70  Temp: 98.2 F (36.8 C) 98.3 F (36.8 C)  Resp: 18 18    General: AAOx3 Cardiovascular: S1S2/RRR Respiratory: CTAB  Discharge Instructions   Discharge Instructions    Diet - low sodium heart healthy    Complete by:  As directed      Increase activity slowly    Complete by:  As directed           Discharge Medication List as of 09/12/2015 12:55 PM    START taking these medications   Details  nicotine (NICODERM CQ - DOSED IN MG/24 HOURS) 21 mg/24hr patch Place 1 patch (21 mg total) onto the skin daily., Starting 09/12/2015, Until Discontinued, Print    thiamine 100 MG tablet Take 1 tablet (100 mg total) by mouth daily., Starting 09/12/2015, Until Discontinued, Print      STOP taking these medications     chlordiazePOXIDE (LIBRIUM) 25 MG capsule        No Known Allergies Follow-up Information    Follow up with [facility] CD[facility]IOP program On 09/14/2015.   Why:  You have an assessment with ANN EVANS for CD IOP program at 4:30pm. No money up front, only assessment.     Contact information:   [address] [place], [place] [address] [phone]       The results of significant diagnostics from  this hospitalization (including imaging, microbiology, ancillary and laboratory) are listed below for reference.    Significant Diagnostic Studies: No results found.  Microbiology: Recent Results (from the past 240 hour(s))  MRSA PCR Screening     Status: None   Collection Time: 09/09/15  3:00 PM  Result Value Ref Range Status   MRSA by PCR NEGATIVE NEGATIVE Final    Comment:        The GeneXpert  MRSA Assay (FDA approved for NASAL specimens only), is one component of a comprehensive MRSA colonization surveillance program. It is not intended to diagnose MRSA infection nor to guide or monitor treatment for MRSA infections.      Labs: Basic Metabolic Panel:  Recent Labs Lab 09/08/15 2312 09/10/15 0318 09/11/15 0531  NA 143 140 136  K 4.0 3.7 3.8  CL 104 102 102  CO2 21* 26 23  GLUCOSE 109* 87 87  BUN CREATININE 0.74 0.66 0.79  CALCIUM 9.0 9.1 9.3   Liver Function Tests:  Recent Labs Lab 09/08/15 2312 09/10/15 0318 09/11/15 0531  AST 111* 89* 79*  ALT 130* 100* 98*  ALKPHOS 61 50 54  BILITOT 0.4 1.0 1.0  PROT 8.2* 6.9 7.0  ALBUMIN 4.5 3.9 3.8   No results for input(s): LIPASE, AMYLASE in the last 168 hours. No results for input(s): AMMONIA in the last 168 hours. CBC:  Recent Labs Lab 09/08/15 2312 09/10/15 0318 09/11/15 0531  WBC 7.3 5.5 5.1  HGB 16.7 14.1 15.2  HCT 47.3 40.8 43.9  MCV 92.9 94.2 94.2  PLT 116* 90* 104*   Cardiac Enzymes: No results for input(s): CKTOTAL, CKMB, CKMBINDEX, TROPONINI in the last 168 hours. BNP: BNP (last 3 results) No results for input(s): BNP in the last 8760 hours.  ProBNP (last 3 results) No results for input(s): PROBNP in the last 8760 hours.  CBG: No results for input(s): GLUCAP in the last 168 hours.     SignedZannie Cove MD.  [facility] 09/12/2015, 4:31 PM

## 2015-09-12 NOTE — Progress Notes (Signed)
Pt discharged from the unit via wheelchair. Discharge instructions were reviewed with the pt. Pt informed to attend outpatient meeting on 4/27. Pt had no questions or concerns at discharge.  Malyssa Maris W Indi Willhite, RN

## 2015-09-12 NOTE — Care Management Note (Signed)
Case Management Note  Patient Details  Name: Christopher Reid MRN: [id number] Date of Birth: Sep 24, 1980  Subjective/Objective:    D/c home w/CSW managing otpt rehab.                Action/Plan:d/c home.   Expected Discharge Date:                 Expected Discharge Plan:  Home/Self Care  In-House Referral:     Discharge planning Services  CM Consult  Post Acute Care Choice:    Choice offered to:     DME Arranged:    DME Agency:     HH Arranged:    HH Agency:     Status of Service:  Completed, signed off  Medicare Important Message Given:    Date Medicare IM Given:    Medicare IM give by:    Date Additional Medicare IM Given:    Additional Medicare Important Message give by:     If discussed at Long Length of Stay Meetings, dates discussed:    Additional Comments:  Lanier ClamMahabir, Leshia Kope, RN 09/12/2015, 12:54 PM

## 2015-09-14 ENCOUNTER — Ambulatory Visit (HOSPITAL_COMMUNITY): Payer: 59 | Admitting: Psychology

## 2015-09-17 ENCOUNTER — Encounter (HOSPITAL_COMMUNITY): Payer: Self-pay | Admitting: Psychology

## 2015-09-17 DIAGNOSIS — F1020 Alcohol dependence, uncomplicated: Secondary | ICD-10-CM | POA: Insufficient documentation

## 2015-09-17 NOTE — Progress Notes (Signed)
Christopher Reid is a 35 y.o. male patient. Orientation to CD-IOP: The patient is a 35 yo single, white, male seeking entry into the CD-IOP. The [place] in [place], [place] He was referred after completing an alcohol detox at [facility]. The patient has long history of alcohol use. He described himself as being a functioning alcohol since shortly after he graduated from college and entered the work force. 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 He admitted he stayed at home drinking for a couple of weeks before he began to look for a new job. The patient reported he was overwhelmed with job seeking after having not looked for work for years. He became very discouraged at the prospects and his drinking increased. The patient reported his father drove down from [place] and surprised him at the house. He had been very worried about his son. He insisted that he seek help and they drove to [facility]. The patient was admitted on April 21 and discharged on Tuesday, April 25th. Today he reported that he had tried to stop drinking on his own, but he would get so sick and hurt so bad, "I had to drink". Today the patient reported he has remained sober 5 days and this is the longest he has gone without a drink in at least 5 years. In recounting the history of his use, the patient reported he first drank around age 35. He first used cannabis at age 35 and used once a week, but his last use was 15 years ago. He also tried cocaine at age 35, but didn't care for it much and hasn't used in over 10 years. 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 He was born and [place]. He is the middle child with a 37 yo sister and 37 yo brother. The patient reported he never saw his parents kiss  or show affection and there was no emotional expression during his childhood. They divorced when he was 89 yo. His father remarried while his mother, who he described as very complaining and negative, has remained alone since then. The patient reported there was addiction on both sides of his family with 2 paternal great uncles who died of cirrhosis as well as his maternal grandfather (who stopped drinking 40 years ago) and an alcoholic maternal uncle. The patient admitted that his drinking 'ruined my relationship" with his S/O and the mother of his 876 yo daughter, Christopher Reid. They still live in [place] and have a cordial relationship although he admitted he hadn't been a very good father for a while now. The patient has no history of psychiatric issues and is not prescribed any medications. He reported that he had attended an AA meeting and I gave him a schedule and recommended some meetings between now and Monday. He was very cooperative and receptive to recommendations. This is his first treatment although he did have a DWI in 2007 and was arrested for underage drinking years ago. The documentation was reviewed, signed and the orientation completed. The patient will return on Monday, May 1st and begin the CD-IOP.         Christopher Reid, LCAS

## 2015-09-18 ENCOUNTER — Other Ambulatory Visit (HOSPITAL_COMMUNITY): Payer: 59 | Attending: Psychiatry | Admitting: Psychology

## 2015-09-18 ENCOUNTER — Other Ambulatory Visit (HOSPITAL_COMMUNITY): Payer: Self-pay | Admitting: Medical

## 2015-09-18 VITALS — BP 142/84 | HR 70 | Resp 16 | Ht 76.0 in | Wt 226.4 lb

## 2015-09-18 DIAGNOSIS — F17200 Nicotine dependence, unspecified, uncomplicated: Secondary | ICD-10-CM | POA: Insufficient documentation

## 2015-09-18 DIAGNOSIS — F10230 Alcohol dependence with withdrawal, uncomplicated: Secondary | ICD-10-CM

## 2015-09-18 DIAGNOSIS — K7010 Alcoholic hepatitis without ascites: Secondary | ICD-10-CM

## 2015-09-18 DIAGNOSIS — Z720 Tobacco use: Secondary | ICD-10-CM

## 2015-09-18 DIAGNOSIS — F329 Major depressive disorder, single episode, unspecified: Secondary | ICD-10-CM

## 2015-09-18 DIAGNOSIS — F1994 Other psychoactive substance use, unspecified with psychoactive substance-induced mood disorder: Secondary | ICD-10-CM

## 2015-09-18 DIAGNOSIS — F4312 Post-traumatic stress disorder, chronic: Secondary | ICD-10-CM | POA: Insufficient documentation

## 2015-09-18 DIAGNOSIS — F32A Depression, unspecified: Secondary | ICD-10-CM

## 2015-09-18 DIAGNOSIS — F1020 Alcohol dependence, uncomplicated: Secondary | ICD-10-CM
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
## 2015-09-19 ENCOUNTER — Encounter (HOSPITAL_COMMUNITY): Payer: Self-pay | Admitting: Psychology

## 2015-09-19 ENCOUNTER — Other Ambulatory Visit (HOSPITAL_COMMUNITY): Payer: 59

## 2015-09-19 NOTE — Progress Notes (Signed)
Daily Group Progress Note  Program: CD-IOP   Group Time: 1-2:30 pm  Participation Level: Active  Behavioral Response: Sharing  Type of Therapy: Process Group  Topic: Process: the first half of group was spent in process. Members shared about the past weekend and the things they had done to support their recovery. Also, members were invited to share about any challenges that may have presented themselves, including thoughts of using, cravings or new realizations. A new group member was present and he introduced himself to the group. The program director met with the 2 newest members during the session today. A drug test was collected from the new group member.   Group Time: 2:45-4 pm  Participation Level: Active  Behavioral Response: Sharing  Type of Therapy: Psycho-education Group  Topic: Psycho-Ed: the second half of group was spent in a psycho-ed on the chronic nature of addiction and the many ways one must learn to manage it. Included was an explanation about the importance of AA/NA and the 12-step community in developing that management plan. A handout on the 12 steps was provided and they were read by group members.  The meaning and purpose behind each step, including the spiritual principle, were discussed at length. Members engaged openly and actively during this session.    Summary: The patient was new to the group and during process, he introduced himself and shared some history about his alcohol use. The patient described himself as having been a functioning alcoholic for at least 10 years. He admitted that he had tried to stop drinking many times on his own, but the sickness and pain was so bad, I would have to drink. I couldnt stop. The patient reported that if he had known the he could get medically detoxed, like he did early last week at [facility], I would have done it years ago. He reported there is extensive alcoholism in his family with paternal and  maternal members addicted. He shared that his S/O and their 45 yo daughter had moved out of the home they shared last year. She had warned him for a long time that she would not remain with him unless he stopped drinking, but I guess I didnt really believe her. The patient reported he had attended the [facility] meeting at the [employer] twice since he first met with this counselor last Thursday. He had found the people very friendly and had spoken with another recovering alcoholic at length after the last meeting had ended.  The patient reported he has 10 days sober as of today and this is the longest he has been alcohol-free since his teens. The patient reported he had received a call from his father last night informing him of his grandfathers passing. The patient noted that his grandfather was 66 yo and had lived a good life, but intended to leave tomorrow for the visitation and funeral. The timing of his plans and his acknowledgement that there would be lots of alcohol, generated a conversation about whether it was wise to go and if he wouldnt be better just staying in [place] and focusing on sobriety? Despite the challenges awaiting him, the patient insisted he was going. The patient met with the program director for part of the psycho-ed. The new patient seemed very comfortable in his first group session and was engaged and active in group. He assured the group he would return next Monday and agreed that he would phone me on Thursday and touch  base. The patient responded well to this intervention and his sobriety date is 4/22.   Family Program: Family present? No   Name of family member(s):   UDS collected: Yes Results: pending  AA/NA attended?: YesThursday, Saturday  Sponsor?: No, but he is very new to recovery.   Bonner Larue, LCAS

## 2015-09-20 ENCOUNTER — Other Ambulatory Visit (HOSPITAL_COMMUNITY): Payer: 59

## 2015-09-21 ENCOUNTER — Other Ambulatory Visit (HOSPITAL_COMMUNITY): Payer: 59

## 2015-09-21 LAB — PRESCRIPTION ABUSE MONITORING 17P, URINE
6-Acetylmorphine, Urine: NEGATIVE ng/mL
Amphetamine Scrn, Ur: NEGATIVE ng/mL
BARBITURATE SCREEN URINE: NEGATIVE ng/mL
BENZODIAZEPINE SCREEN, URINE: NEGATIVE ng/mL
BUPRENORPHINE, URINE: NEGATIVE ng/mL
CANNABINOIDS UR QL SCN: NEGATIVE ng/mL
COCAINE(METAB.)SCREEN, URINE: NEGATIVE ng/mL
Carisoprodol/Meprobamate, Ur: NEGATIVE ng/mL
Creatinine(Crt), U: 11.4 mg/dL — ABNORMAL LOW (ref 20.0–300.0)
EDDP, Urine: NEGATIVE ng/mL
FENTANYL, URINE: NEGATIVE pg/mL
MDMA Screen, Urine: NEGATIVE ng/mL
MEPERIDINE SCREEN, URINE: NEGATIVE ng/mL
Methadone Screen, Urine: NEGATIVE ng/mL
Nitrite Urine, Quantitative: NEGATIVE ug/mL
OPIATE SCREEN URINE: NEGATIVE ng/mL
OXYCODONE+OXYMORPHONE UR QL SCN: NEGATIVE ng/mL
PH UR, DRUG SCRN: 6.5 (ref 4.5–8.9)
Phencyclidine Qn, Ur: NEGATIVE ng/mL
Propoxyphene Scrn, Ur: NEGATIVE ng/mL
SPECIFIC GRAVITY: 1.002
TAPENTADOL, URINE: NEGATIVE ng/mL
TRAMADOL SCREEN, URINE: NEGATIVE ng/mL

## 2015-09-21 LAB — ETHYL GLUCURONIDE, URINE: Ethyl Glucuronide Screen, Ur: NEGATIVE ng/mL

## 2015-09-22 ENCOUNTER — Telehealth (HOSPITAL_COMMUNITY): Payer: Self-pay | Admitting: Psychology

## 2015-09-22 ENCOUNTER — Other Ambulatory Visit (HOSPITAL_COMMUNITY): Payer: 59

## 2015-09-25 ENCOUNTER — Other Ambulatory Visit (HOSPITAL_COMMUNITY): Payer: Self-pay | Admitting: Medical

## 2015-09-25 ENCOUNTER — Other Ambulatory Visit (HOSPITAL_COMMUNITY): Payer: 59 | Admitting: Psychology

## 2015-09-25 DIAGNOSIS — K7010 Alcoholic hepatitis without ascites: Secondary | ICD-10-CM

## 2015-09-25 DIAGNOSIS — F1020 Alcohol dependence, uncomplicated: Secondary | ICD-10-CM

## 2015-09-26 ENCOUNTER — Other Ambulatory Visit (HOSPITAL_COMMUNITY): Payer: 59

## 2015-09-27 ENCOUNTER — Other Ambulatory Visit (HOSPITAL_COMMUNITY): Payer: 59 | Admitting: Psychology

## 2015-09-27 DIAGNOSIS — K7010 Alcoholic hepatitis without ascites: Secondary | ICD-10-CM

## 2015-09-27 DIAGNOSIS — F1020 Alcohol dependence, uncomplicated: Secondary | ICD-10-CM

## 2015-09-27 LAB — PRESCRIPTION ABUSE MONITORING 17P, URINE
6-Acetylmorphine, Urine: NEGATIVE ng/mL
Amphetamine Scrn, Ur: NEGATIVE ng/mL
BARBITURATE SCREEN URINE: NEGATIVE ng/mL
BENZODIAZEPINE SCREEN, URINE: NEGATIVE ng/mL
Buprenorphine, Urine: NEGATIVE ng/mL
CANNABINOIDS UR QL SCN: NEGATIVE ng/mL
Carisoprodol/Meprobamate, Ur: NEGATIVE ng/mL
Cocaine (Metab) Scrn, Ur: NEGATIVE ng/mL
Creatinine(Crt), U: 42 mg/dL (ref 20.0–300.0)
EDDP, URINE: NEGATIVE ng/mL
FENTANYL, URINE: NEGATIVE pg/mL
MDMA Screen, Urine: NEGATIVE ng/mL
MEPERIDINE SCREEN, URINE: NEGATIVE ng/mL
METHADONE SCREEN, URINE: NEGATIVE ng/mL
NITRITE URINE, QUANTITATIVE: NEGATIVE ug/mL
OPIATE SCREEN URINE: NEGATIVE ng/mL
OXYCODONE+OXYMORPHONE UR QL SCN: NEGATIVE ng/mL
PH UR, DRUG SCRN: 5.9 (ref 4.5–8.9)
PROPOXYPHENE SCREEN URINE: NEGATIVE ng/mL
Phencyclidine Qn, Ur: NEGATIVE ng/mL
SPECIFIC GRAVITY: 1.009
TAPENTADOL, URINE: NEGATIVE ng/mL
Tramadol Screen, Urine: NEGATIVE ng/mL

## 2015-09-28 ENCOUNTER — Other Ambulatory Visit (HOSPITAL_COMMUNITY): Payer: 59 | Admitting: Psychology

## 2015-09-28 ENCOUNTER — Encounter (HOSPITAL_COMMUNITY): Payer: Self-pay | Admitting: Psychology

## 2015-09-28 DIAGNOSIS — F1020 Alcohol dependence, uncomplicated: Secondary | ICD-10-CM

## 2015-09-29 ENCOUNTER — Other Ambulatory Visit (HOSPITAL_COMMUNITY): Payer: 59

## 2015-09-29 ENCOUNTER — Encounter (HOSPITAL_COMMUNITY): Payer: Self-pay | Admitting: Psychology

## 2015-09-29 NOTE — Progress Notes (Addendum)
    Daily Group Progress Note  Program: CD-IOP   Group Time: 1-2:30 pm  Participation Level: Active  Behavioral Response: Appropriate and Sharing  Type of Therapy: Process Group  Topic: Process: the first half of group was spent in process. Members checked-in and shared the things they had done since our last session to support their sobriety. No one had used since the last group session. Members shared about things they had learned and the meetings they had attended. The program director met with a new group member and discussed meds with another member. Two random drug tests were collected today.  Group Time: 2:45- 4pm  Participation Level: Active  Behavioral Response: Sharing  Type of Therapy: Psycho-education Group  Topic: Psycho-Ed: Chaplain and Spirituality. The second half of group was spent in a psycho-ed with the visiting Chaplain. After a brief introduction from both members and the chaplain, the topic of Spirituality was presented. Members were asked to identify how they define 'Spirituality" and how they practice that spirituality daily. The immediate response was really about organized religion and members expressed their frustration, anger and hurt. The chaplain reminded the patients the topic was spirituality and the subsequent perceptions and ideas changed markedly. Members were active and energized in this session and it proved very compelling for all present.   Summary: The patient was engaged and active in group today. The patient reported he had attended 1 AA meeting since our last session. He shared that he had sat down to pay bills yesterday and "got scared" seeing the money go out, but none coming in. The patient admitted he went to [employer] and filled out an application. He felt like he could work a few hours there to keep busy and have a little something coming in. He shared that he spent time with his 58 yo daughter and had gone with her and her classmates to the  [employer]. The patient admitted he didn't realize he would be assigned as chaperone for 5 little 37 yo girls, but it was fun. In the psycho-ed, the patient shared openly about his own spirituality and provided good feedback about himself during his addiction, "I was self-centered and it made me miserable". When asked to provide his definition of spirituality, the patient explained that "identifying and following that which is the greatest good for me". The chaplain found this interesting and noted that the original [place] meaning of spirituality was 'what helps you flourish". The patient displayed the willingness to accept recommendations and good insight into his own issues and character flaws. He is making substantial progress and responded well to this intervention. His sobriety date remains 4/22.     Family Program: Family present? No   Name of family member(s):   UDS collected: No Results: but drug test collected on Monday was negative.  AA/NA attended?: [place]  Sponsor?: No   Addelynn Batte, LCAS
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
## 2015-09-30 ENCOUNTER — Encounter (HOSPITAL_COMMUNITY): Payer: Self-pay
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
## 2015-10-02 ENCOUNTER — Other Ambulatory Visit (HOSPITAL_COMMUNITY): Payer: 59 | Admitting: Psychology

## 2015-10-02 DIAGNOSIS — K7010 Alcoholic hepatitis without ascites: Secondary | ICD-10-CM

## 2015-10-02 DIAGNOSIS — F1020 Alcohol dependence, uncomplicated: Secondary | ICD-10-CM

## 2015-10-03 ENCOUNTER — Other Ambulatory Visit (HOSPITAL_COMMUNITY): Payer: 59

## 2015-10-03 ENCOUNTER — Encounter (HOSPITAL_COMMUNITY): Payer: Self-pay | Admitting: Psychology

## 2015-10-03 NOTE — Progress Notes (Signed)
    Daily Group Progress Note  Program: CD-IOP   Group Time: 1-2:30 pm  Participation Level: Active  Behavioral Response: Appropriate and Sharing  Type of Therapy: Process Group  Topic: Process. The first half of group was spent in process. Members discussed any problems or temptations they have experienced in early recovery. Included in this discussion were frustrations about family and expectations and the stress this generates for the recovering person.  During this session, random drug tests were collected from 3 group members.   Group Time: 2:45- 4pm  Participation Level: Active  Behavioral Response: Appropriate  Type of Therapy: Psycho-education Group  Topic: Psycho-Ed, The Pros and Cons: The second half of group was spent in a psycho-ed. This session challenged group members to identify the Pros and Cons of Sobriety versus Active Addiction. The board was divided into 4 parts with each part representing all 4 aspects of this topic. While some pros of both using and sobriety were shared by most of the members, other specific benefits or negatives seemed unique. There was a good degree of disclosure among the group and an openness to identify the more painful or shameful things that have occurred while using.   Summary: The patient reported he had attended an AA meeting last night. It had been considerably different than the ones he goes to [facility] [employer]. This one, named the "Mustard Seed", was more like a country club, the patient noted, but it was a good meeting and everyone was very welcoming to him. The patient reported that he had shared during the meeting and it dealt with what this group had discussed yesterday regarding Step 1. He introduced the word 'accepted' versus 'admitted' and this generated a good discussion in the AA meeting. The patient explained that this term 'accepted' helps him take "ownership" of his addiction more readily. He stayed after the meeting was  over and helped put away chairs and clean up. The patient reported he had drive his 836 yo daughter to school this morning and that had been great fun.  During the psycho-ed, the patient identified the pros of addiction as "enjoying the taste'. The counselor pointed out that while some alcoholics don't like the taste of alcohol and just drink it for the effect, while this fellow clearly enjoyed the taste in addition to what he gets from it. The cons include the loss of values and being sick. When discussing the pros of sobriety, the patient identified a growing confidence and self-worth while the negative of sobriety is feeling ones' feelings. The patient was engaged shared openly in this session. He is making good progress and displays a growing interest in learning about recovery. He responded well to this intervention and his sobriety date remains 4/22.  Family Program: Family present? No   Name of family member(s):   UDS collected: No Results:   AA/NA attended?: YesWednesday  Sponsor?: No   Assata Juncaj, LCAS

## 2015-10-04 ENCOUNTER — Other Ambulatory Visit (HOSPITAL_COMMUNITY): Payer: 59 | Admitting: Psychology

## 2015-10-04 DIAGNOSIS — F1020 Alcohol dependence, uncomplicated: Secondary | ICD-10-CM
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
## 2015-10-05 ENCOUNTER — Other Ambulatory Visit (HOSPITAL_COMMUNITY): Payer: 59 | Admitting: Psychology

## 2015-10-05 DIAGNOSIS — F1020 Alcohol dependence, uncomplicated: Secondary | ICD-10-CM

## 2015-10-05 DIAGNOSIS — K7010 Alcoholic hepatitis without ascites: Secondary | ICD-10-CM

## 2015-10-06 ENCOUNTER — Encounter (HOSPITAL_COMMUNITY): Payer: Self-pay | Admitting: Psychology

## 2015-10-06 ENCOUNTER — Other Ambulatory Visit (HOSPITAL_COMMUNITY): Payer: 59

## 2015-10-06 NOTE — Progress Notes (Signed)
    Daily Group Progress Note  Program: CD-IOP   Group Time: 1-2:30 pm  Participation Level: Active  Behavioral Response: Appropriate and Sharing  Type of Therapy: Process Group  Topic: CD-IOP: Process.  The first half of group was spent in process. Members shared about current issues and challenges they are dealing with in early recovery. Two members were present that had been absent when the group last met. Drug tests were collected from those 2 group members.   Group Time: 2:45- 4pm  Participation Level: Active  Behavioral Response: Appropriate and Sharing  Type of Therapy: Psycho-education Group  Topic: Psycho-Ed: The second half of group was spent in a psycho-ed on 'Communication". A handout was provided identifying the 4 different types of communication styles. These include passive, passive-aggressive, aggressive and assertive. Members were asked to identify their own styles of communication and the styles that those closest to them most often display. The reasons why some might choose to embrace these distinctive styles were discussed. One member described at length why she had become a 'passive' communicator long ago and why she has kept close to that style through the years. It proved to be a very poignant disclosure.   Summary: The patient reported he had attended 2 AA meetings since we last met. He noted that the graduation ceremony had been very powerful yesterday and he hoped, "I can do that". The patient reported that the topic of the meeting this morning was "Asking for Help". He agreed with another member that the meaning behind this message is that "I can't do it myself". The patient reported he felt like he should be getting a sponsor, but he admitted he didn't know how to go about doing that? The patient received good feedback from his fellow group members and the 2 members that have sponsors explained how they go them and what they do together. Each of those 2 fellows  reported they phone their sponsor at least once per day. In the psycho-ed, the patient was engaged in the discussion. When asked what some of the signals or behaviors that display good listening and communicating, he was quick to point out eye contact. The patient also reported he found that the tone or volume one speaks in can be very important in determining whether the communication is effective. He interpreted aggressive behavior as 'laziness' and explained that those types of people seemed to always get others to do their work. The patient described himself as an assertive communicator when it came to work, but admitted he was often times passive when he was drinking, especially with his S/O to avoid conflict and let her have her way. The patient is a strong group member who shares about his own struggles and is very revealing about the dynamics of his relationships. He is very active in AA and doing everything we have asked of him. He responded well to this intervention and his sobriety date remains 4/22.   Family Program: Family present? No   Name of family member(s):   UDS collected: No Results:   AA/NA attended?: YesWednesday and Thursday  Sponsor?: No   EVANS,ANN, LCAS        

## 2015-10-09 ENCOUNTER — Other Ambulatory Visit (HOSPITAL_COMMUNITY): Payer: 59 | Admitting: Psychology

## 2015-10-09 DIAGNOSIS — F1020 Alcohol dependence, uncomplicated: Secondary | ICD-10-CM

## 2015-10-10 ENCOUNTER — Other Ambulatory Visit (HOSPITAL_COMMUNITY): Payer: 59

## 2015-10-10 ENCOUNTER — Encounter (HOSPITAL_COMMUNITY): Payer: Self-pay | Admitting: Psychology
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
## 2015-10-11 ENCOUNTER — Other Ambulatory Visit (HOSPITAL_COMMUNITY): Payer: 59 | Admitting: Psychology

## 2015-10-11 ENCOUNTER — Other Ambulatory Visit (HOSPITAL_COMMUNITY): Payer: Self-pay | Admitting: Medical

## 2015-10-11 DIAGNOSIS — F1020 Alcohol dependence, uncomplicated: Secondary | ICD-10-CM

## 2015-10-12 ENCOUNTER — Other Ambulatory Visit (HOSPITAL_COMMUNITY): Payer: 59 | Admitting: Psychology

## 2015-10-12 ENCOUNTER — Encounter (HOSPITAL_COMMUNITY): Payer: Self-pay | Admitting: Psychology

## 2015-10-12 DIAGNOSIS — F1020 Alcohol dependence, uncomplicated: Secondary | ICD-10-CM
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
## 2015-10-13 ENCOUNTER — Other Ambulatory Visit (HOSPITAL_COMMUNITY): Payer: 59

## 2015-10-13 ENCOUNTER — Encounter (HOSPITAL_COMMUNITY): Payer: Self-pay | Admitting: Psychology

## 2015-10-13 LAB — PRESCRIPTION ABUSE MONITORING 17P, URINE
6-Acetylmorphine, Urine: NEGATIVE ng/mL
Amphetamine Scrn, Ur: NEGATIVE ng/mL
BARBITURATE SCREEN URINE: NEGATIVE ng/mL
BENZODIAZEPINE SCREEN, URINE: NEGATIVE ng/mL
Buprenorphine, Urine: NEGATIVE ng/mL
CANNABINOIDS UR QL SCN: NEGATIVE ng/mL
Carisoprodol/Meprobamate, Ur: NEGATIVE ng/mL
Cocaine (Metab) Scrn, Ur: NEGATIVE ng/mL
Creatinine(Crt), U: 116.2 mg/dL (ref 20.0–300.0)
EDDP, Urine: NEGATIVE ng/mL
Fentanyl, Urine: NEGATIVE pg/mL
MDMA Screen, Urine: NEGATIVE ng/mL
Meperidine Screen, Urine: NEGATIVE ng/mL
Methadone Screen, Urine: NEGATIVE ng/mL
Nitrite Urine, Quantitative: NEGATIVE ug/mL
OXYCODONE+OXYMORPHONE UR QL SCN: NEGATIVE ng/mL
Opiate Scrn, Ur: NEGATIVE ng/mL
Ph of Urine: 6.7 (ref 4.5–8.9)
Phencyclidine Qn, Ur: NEGATIVE ng/mL
Propoxyphene Scrn, Ur: NEGATIVE ng/mL
SPECIFIC GRAVITY: 1.018
Tapentadol, Urine: NEGATIVE ng/mL
Tramadol Screen, Urine: NEGATIVE ng/mL

## 2015-10-13 LAB — ETHYL GLUCURONIDE, URINE: ETHYL GLUCURONIDE SCREEN, UR: NEGATIVE ng/mL

## 2015-10-13 NOTE — Progress Notes (Signed)
    Daily Group Progress Note  Program: CD-IOP   Group Time: 1-2:30  Participation Level: Active  Behavioral Response: Appropriate and Sharing  Type of Therapy: Process Group  Topic: Counselor met with patients for group process session. Session lasted 1.5 hours and content focused on processing pt.'s experiences in recovery. Counselor inquired about emotions and thoughts related to sobriety and challenges that pt.'s are facing. One group member became significantly tearful and left the room when discussing suicide. The counselor went to check on her in the lobby. She eventually came back and processed her emotions with the group. One group member arrived 18 mins late due to work. A drug test was collected from this member as well.      Group Time: 2:45-4  Participation Level: Active  Behavioral Response: Appropriate and Sharing  Type of Therapy: Psycho-education Group  Topic: Counselor met with patients for group psycho-ed session. Session lasted 1.25 hours and content focused on specific recovery-planning for a long holiday weekend. The counselor provided a week-long hourly chart so pt.'s could plan a safe holiday weekend. Patients were given time to fill out the sheet and then each group member shared about their plans and any "weak points" in their plan and how they could seek extra help during those times.   Summary: Patient presented as active and engaged in session. Pt. displays a high degree of motivation to stay sober in his affect and verbalizations. He makes strong eye-contact and seems to have a genuine desire to work a recovery program and "do right by his daughter". Pt. shared that he attended 1 AA meeting since our last group. Counselor confronted pt. on his use of a dismissive stereotype concerning suicidal ideation which caused another group member to get upset. Group members reassured this pt. that he had done nothing wrong but that he needs to be more aware of the  intent of his language. Patient remained quiet for much of the rest of session and admitted that he "felt bad" about offending the other group member. 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 He hopes to attend an AA meeting at least 1 time/day. Patient's sobriety date remains 73-22.   Family Program: Family present? NA   Name of family member(s):   UDS collected: No Results: negative  AA/NA attended?: YesThursday  Sponsor?: No   Delshon Blanchfield, LCAS

## 2015-10-15 ENCOUNTER — Encounter (HOSPITAL_COMMUNITY): Payer: Self-pay | Admitting: Psychology

## 2015-10-15 NOTE — Progress Notes (Signed)
    Daily Group Progress Note  Program: CD-IOP   Group Time: 1-2:30 pm  Participation Level: Active  Behavioral Response: Appropriate and Sharing  Type of Therapy: Process Group  Topic: CD-IOP: Process: the first part of group was spent in process. Members shared about the things they are doing to support their recovery. They were invited to share any obstacles to their sobriety as well, including problems with loved ones, work or just the general unease so common in early recovery. Two members were absent, both excused, while a new member did not appear. The program director met with 3 group members during the session today.   Group Time: 2:45- 4pm  Participation Level: Active  Behavioral Response: appropriate  Type of Therapy: Psycho-education Group  Topic: Psycho-Ed: Honesty; Part 2/Graduation. The second part of group was spent in a pscyho-ed focusing on the topic of honesty. A handout had been provided and discussed no Monday and this was the second part of this essential topic. Near the conclusion of group, a graduation ceremony was held for a graduating member. Kind words were shared to this well-respected group member. His wife had appeared and was also present as the ceremony was held and it was emotional for both the graduating member and his wife.    Summary: The patient reported he had been very busy since our last session. He had met for his second interview and was offered a job with [employer]. The patient was quick to explain that he had informed this potential employer at his first interview there were days that he could not work. He had no intention to let this job jeopardize his recovery. The patient had attended 3 AA meetings and one of the meetings included the topic of amends. He admitted that it was a little overwhelming to think of the amends he would have to make. Another member reminded him that when it was time to make those amends, he would be ready. The  patient also noted that he had spent almost 3 hours in the Urgent Care with his ex-S/O. she had been experiencing bouts of dizziness and he had decided to sit with her and wait for the doctor. The patient reported it felt good to be able to be there with her. It is not something he has done in the past and he certainly hasn't been emotionally available. The patient was engaged and active in the psycho-ed on honesty. He agreed that he had been very dishonest with almost everyone in his life during his active drinking. This is what was so overwhelming about "making amends". And what if certain people that are important to him don't accept this amends? Another member reminded the patient that he cannot change anyone else and if he just focuses on what he can change and do the next best thing, it will work out. This was a wise suggestion, but like many new folks in recovery, the ability to not worry about tomorrow continues to play out ion his own recovery. He shared kind words of respect to the graduating member and he responded well to this intervention. The patient's sobriety date remains 4/22.   Family Program: Family present? No  Name of family member(s):   UDS collected: No  AA/NA attended?: yes, 3 meetings Monday, Tuesday and Wednesday  Sponsor?: No   Kaidan Harpster, LCAS

## 2015-10-17 ENCOUNTER — Other Ambulatory Visit (HOSPITAL_COMMUNITY): Payer: 59

## 2015-10-17 ENCOUNTER — Encounter (HOSPITAL_COMMUNITY): Payer: Self-pay | Admitting: Psychology

## 2015-10-17 NOTE — Progress Notes (Signed)
    Daily Group Progress Note  Program: CD-IOP   Group Time: 1-2:30 pm  Participation Level: Active  Behavioral Response: Appropriate and Sharing  Type of Therapy: Process Group  Topic: Process: The first part of group was spent in process. Members shared about the past weekend and identified the things they had done to support their recovery. They were also invited to discuss any challenges or triggers that they were faced with and how they might have addressed them. The program director met with 3 group members during group today. Five random drug tests were also collected.   Group Time: 2:45- 4pm  Participation Level: Active  Behavioral Response: Appropriate  Type of Therapy: Psycho-education Group  Topic: Psycho-Ed: Honesty in Recovery/Graduation. The second half of group was spent in a psycho-ed on the topic of Honesty. A handout taken from "Seeking Safety" was provided and members read and discussed from the handout. Several scenarios were offered and members provided their responses to each of them. Their responses differed and members provided explanations for their answers. The discussion generated challenging comments among group members. The session ended with a graduation ceremony honoring a member who was graduating from the program today. Kind words were shared as the medallion was passed around and brownies eaten. The graduating member shared her hopes and wishes from her fellow group members and assured them all she would remain sober and engaged in [place].   Summary: The patient reported he had spent a lot of time with his 36 yo daughter. She is a Pension scheme manager and he had skated with her over the weekend. He noted that he had gone to have lunch with her on Friday, but because he was 3 minutes late - 11:33 - the guard would not let him come in and sit with her. He was very angry for a while after that, but finally managed to calm himself down. The patient reported he had been bored on  Saturday and read a little of the 'Big Book'. "There is good stuff in there", he noted. The patient attended a total of 5 AA meetings over the weekend. During the psycho-ed, the patient admitted he would not be honest when it came to telling a 55 yo daughter that he had drank. He didn't want to disappoint her. The patient reported that he feels angry when others are judging him and noted that people just don't understand this disease. The patient shared kind words with the graduating member and wished her well. The patient continues to make substantial progress in very early recovery. He is doing everything we have asked of him. His sobriety date remains 4/22.  Family Program: Family present? No   Name of family member(s):   DS collected: No Results:   AA/NA attended?: YesMonday, Thursday, Friday, Saturday and Sunday  Sponsor?: No   Christopher Reid, LCAS

## 2015-10-18 ENCOUNTER — Other Ambulatory Visit (HOSPITAL_COMMUNITY): Payer: Self-pay | Admitting: Medical

## 2015-10-18 ENCOUNTER — Other Ambulatory Visit (HOSPITAL_COMMUNITY): Payer: 59 | Admitting: Psychology

## 2015-10-18 DIAGNOSIS — F1020 Alcohol dependence, uncomplicated: Secondary | ICD-10-CM

## 2015-10-18 DIAGNOSIS — K7010 Alcoholic hepatitis without ascites: Secondary | ICD-10-CM

## 2015-10-19 ENCOUNTER — Other Ambulatory Visit (HOSPITAL_COMMUNITY): Payer: 59 | Attending: Psychiatry | Admitting: Psychology

## 2015-10-19 ENCOUNTER — Encounter (HOSPITAL_COMMUNITY): Payer: Self-pay | Admitting: Psychology

## 2015-10-19 DIAGNOSIS — K7010 Alcoholic hepatitis without ascites: Secondary | ICD-10-CM | POA: Insufficient documentation

## 2015-10-19 DIAGNOSIS — F10230 Alcohol dependence with withdrawal, uncomplicated: Secondary | ICD-10-CM | POA: Insufficient documentation

## 2015-10-19 DIAGNOSIS — F329 Major depressive disorder, single episode, unspecified: Secondary | ICD-10-CM | POA: Insufficient documentation

## 2015-10-19 DIAGNOSIS — I10 Essential (primary) hypertension: Secondary | ICD-10-CM | POA: Insufficient documentation

## 2015-10-19 DIAGNOSIS — F1020 Alcohol dependence, uncomplicated: Secondary | ICD-10-CM

## 2015-10-20 ENCOUNTER — Other Ambulatory Visit (HOSPITAL_COMMUNITY): Payer: 59

## 2015-10-20 ENCOUNTER — Encounter (HOSPITAL_COMMUNITY): Payer: Self-pay | Admitting: Psychology

## 2015-10-20 LAB — PRESCRIPTION ABUSE MONITORING 17P, URINE
6-ACETYLMORPHINE, URINE: NEGATIVE ng/mL
AMPHETAMINE SCREEN URINE: NEGATIVE ng/mL
BARBITURATE SCREEN URINE: NEGATIVE ng/mL
BENZODIAZEPINE SCREEN, URINE: NEGATIVE ng/mL
Buprenorphine, Urine: NEGATIVE ng/mL
CANNABINOIDS UR QL SCN: NEGATIVE ng/mL
Carisoprodol/Meprobamate, Ur: NEGATIVE ng/mL
Cocaine (Metab) Scrn, Ur: NEGATIVE ng/mL
Creatinine(Crt), U: 73.4 mg/dL (ref 20.0–300.0)
EDDP, Urine: NEGATIVE ng/mL
FENTANYL, URINE: NEGATIVE pg/mL
MDMA SCREEN, URINE: NEGATIVE ng/mL
METHADONE SCREEN, URINE: NEGATIVE ng/mL
Meperidine Screen, Urine: NEGATIVE ng/mL
Nitrite Urine, Quantitative: NEGATIVE ug/mL
OPIATE SCREEN URINE: NEGATIVE ng/mL
OXYCODONE+OXYMORPHONE UR QL SCN: NEGATIVE ng/mL
PH UR, DRUG SCRN: 6.8 (ref 4.5–8.9)
PROPOXYPHENE SCREEN URINE: NEGATIVE ng/mL
Phencyclidine Qn, Ur: NEGATIVE ng/mL
SPECIFIC GRAVITY: 1.022
TAPENTADOL, URINE: NEGATIVE ng/mL
Tramadol Screen, Urine: NEGATIVE ng/mL

## 2015-10-20 LAB — ETHYL GLUCURONIDE, URINE: ETHYL GLUCURONIDE SCREEN, UR: NEGATIVE ng/mL

## 2015-10-20 NOTE — Progress Notes (Signed)
Christopher Reid is a 35 y.o. male patient. Counselor met with patient for 1 hour individual counseling session after group therapy. Patient presented as active and engaged in session. He displays a high level of motivation to maintain sobriety. Patient reported that is sleeping well. Patient reported that he is currently struggling with making friends in [place]. He reported that he is enjoying his new work at a [employer], but he feels "uncomfortable staying late at [employer]" since there is no job to be done. Patient may feel insecure being vulnerable with people in recovery since he identifies strongly with his work-life. Counselor asked patient to think about a goal he has for being more social which they would discuss more at the next individual session. Patient spent significant time discussing his resentment towards his s/o's father who has lived with patient for 4 years, but paid $0 in rent. Counselor and patient discussed strategies for communicating to family members clearly and without getting overwhelmed. Patient reported that he is active in [place] and hopes to make more friends. He has a sponsor. Counselor continued to use open questions and reflection to build rapport and help client explore his emotional and cognitive experience in session. Patient's sobriety date remains 4/22. Youlanda Roys, Counselor)        Brandon Melnick, LCAS

## 2015-10-23 ENCOUNTER — Other Ambulatory Visit (HOSPITAL_COMMUNITY): Payer: 59 | Admitting: Psychology

## 2015-10-23 ENCOUNTER — Encounter (HOSPITAL_COMMUNITY): Payer: Self-pay | Admitting: Psychology

## 2015-10-23 ENCOUNTER — Other Ambulatory Visit (HOSPITAL_COMMUNITY): Payer: Self-pay | Admitting: Medical

## 2015-10-23 DIAGNOSIS — F1020 Alcohol dependence, uncomplicated: Secondary | ICD-10-CM

## 2015-10-23 NOTE — Progress Notes (Signed)
    Daily Group Progress Note  Program: CD-IOP   Group Time: 1-2:30  Participation Level: Active  Behavioral Response: Appropriate and Sharing  Type of Therapy: Process Group  Topic: Counselors met with patients for group process session. Patients discussed their recovery from mind-altering drugs and alcohol. Patients discussed feelings and thoughts related to patient who had decided not to go forward with treatment. One patient discussed his absence from group yesterday. One patient took a drug test.     Group Time: 2:45-4  Participation Level: Active  Behavioral Response: Appropriate and Sharing  Type of Therapy: Psycho-education Group  Topic: Counselors met with patients for group psychoeducation session. Topic included "family roles" within the dysfunctional/alcoholic family. Patients were active and engaged in session.    Summary: Patient presented as active and engaged in session today. He continues to maintain a positive outlook, upbeat affect, and displays a high level of motivation to remain sober. Patient reported that he attended 1 AA meeting. Patient shared about his need to improve his boundaries with his significant other's family. Patient shared that he feels a desire to reach out to more people in recovery and build social support outside of [employer]. Counselor encouraged pt. to arrive early or stay late at meetings to meet new friends. Pt. reported that he enjoys his new work. Patient's sobriety date remains 4/22. Youlanda Roys, Counselor)   Family Program: Family present? NA   Name of family member(s):   UDS collected: No Results: negative  AA/NA attended?: YesWednesday and Thursday  Sponsor?: Yes   Makensie Mulhall, LCAS

## 2015-10-24 ENCOUNTER — Encounter (HOSPITAL_COMMUNITY): Payer: Self-pay | Admitting: Psychology

## 2015-10-24 ENCOUNTER — Other Ambulatory Visit (HOSPITAL_COMMUNITY): Payer: 59

## 2015-10-24 NOTE — Progress Notes (Signed)
    Daily Group Progress Note  Program: CD-IOP   Group Time: 1-2:30   Participation Level: Active  Behavioral Response: Appropriate and Sharing  Type of Therapy: Process Group  Topic: Counselors met with patients for group process session. Patients discussed their recovery from mind-altering drugs and alcohol. 2 new patients started group today and 1 of those patients met with Investment banker, operational. Both new patients shared openly about their history of drug/alcohol dependence. Drug tests were collected from all members. Patients were active and engaged in session today.   Group Time: 2:45-4  Participation Level: Active  Behavioral Response: Appropriate and Sharing  Type of Therapy: Psycho-education Group  Topic: Counselors and guest IT sales professional met with patients for group psychoeducation session. Topic included a 1 hour "chair yoga" session in which patients were instructed to complete non-strenuous stretches, body postures, and breathing-awareness. Patients were active, engaged, and focused during session. Counselors and instructors processed the session afterward and discussed distractions, breathing, and individualizing patient's practice of yoga to aid in their recovery.   Summary: Sobriety date 4/22 Patient presented to group counseling today and was active and engaged in session. Patient reported that he had attended 7 AA meetings. He reported that he had spent much of the break working and spending time with his daughter and significant other. Patient shared about his hopes to ask his roommate to move out soon, which he thinks will be difficult. Patient shared that he had updated his resume and Linked-in profile since he hopes to get a higher paying job soon. Patient discussed his past as an isolation drinker and the hope he feels from being active in [place] and meeting other people in recovery. Youlanda Roys, Counselor)   Family Program: Family present? NA   Name of family  member(s):   UDS collected: Yes Results: negative  AA/NA attended?: YesFriday, Saturday and Sunday  Sponsor?: Yes   Tykeshia Tourangeau, LCAS

## 2015-10-24 NOTE — Progress Notes (Signed)
    Daily Group Progress Note  Program: CD-IOP   Group Time: 1-2:30pm  Participation Level: Active  Behavioral Response: Appropriate and Sharing  Type of Therapy: Process Group  Topic: Process: the first half of group was spent in process. Members shared about the things that have transpired since this group met last week on Thursday. The holiday weekend had extended our session and there was a lot to discuss. There were no new sobriety dates, but members shared about their struggles, challenges and successes in sobriety. Random drug tests were collected from all members present today. The program director met with 2 group members during the session today.   Group Time: 2:45-4 pm  Participation Level: Active  Behavioral Response: Appropriate  Type of Therapy: Psycho-education Group  Topic: Psycho-Ed: the second half of group was spent in a psycho-ed. The focus of this session was on Family Roles in Addictive or Dysfunctional Family Systems. The counselor provided a handout on family roles and a discussion ensued. The session was disrupted when the member who had just met with the program director returned and announced she had to leave the group. She cried as she explained he was going to take her off her medication and she didn't want that. The counselor reminding members that certain meds are not allowed in this program including benzodiazepines. The patient cried as she explained she had been taking klonipin for 5 years and her psychiatrist had told her she shouldn't stop taking it. Group members shared their feelings and discomfort with this information. The patient got up and walked out of the group room and while this counselor followed her out, the co-therapist shared briefly with members before the end of the group session.   Summary: The patient described a busy weekend with time spent with his 93 yo daughter and attending 10 AA meetings since we last met in group. He reported  taking his daughter to the pool and attending some of her skating practices. At one point, the patient described going out for dinner with his daughter and her mother. They went to [employer] and he talked about how 'normal' it seemed to be together just getting some dinner. He had also worked and described Saturday as being the busiest day he had worked since he first started his new job. "It was intense", he reported and he admitted having to practice "not being in charge" as he has in his previous job. The patient also reported he had sat and watched an entire movie - "Unbroken" and he was amazed because he hadn't watched an entire movie in years. The patient provided supportive feedback to his fellow group member as she shared her disappointment in not being able to stay in the group. "There must be something we can do", he said, but eventually it became clear to this member and all others present, that the rules could not be broken for just one person. He continues to make excellent progress in his recovery and is doing everything we have asked of him. The patient responded well to this intervention and his sobriety date remains 4/22.   Family Program: Family present? No   Name of family member(s):   UDS collected: Yes Results: negative  AA/NA attended?: YesMonday, Tuesday, Wednesday, Thursday, Friday, Saturday and Sunday  Sponsor?: Yes   Kristopher Attwood, LCAS

## 2015-10-25 ENCOUNTER — Other Ambulatory Visit (HOSPITAL_COMMUNITY): Payer: 59 | Admitting: Psychology

## 2015-10-25 DIAGNOSIS — K7010 Alcoholic hepatitis without ascites: Secondary | ICD-10-CM

## 2015-10-25 DIAGNOSIS — F1020 Alcohol dependence, uncomplicated: Secondary | ICD-10-CM

## 2015-10-25 LAB — PRESCRIPTION ABUSE MONITORING 17P, URINE
6-Acetylmorphine, Urine: NEGATIVE ng/mL
Amphetamine Scrn, Ur: NEGATIVE ng/mL
BARBITURATE SCREEN URINE: NEGATIVE ng/mL
BENZODIAZEPINE SCREEN, URINE: NEGATIVE ng/mL
BUPRENORPHINE, URINE: NEGATIVE ng/mL
CANNABINOIDS UR QL SCN: NEGATIVE ng/mL
COCAINE(METAB.)SCREEN, URINE: NEGATIVE ng/mL
Carisoprodol/Meprobamate, Ur: NEGATIVE ng/mL
Creatinine(Crt), U: 59.9 mg/dL (ref 20.0–300.0)
EDDP, Urine: NEGATIVE ng/mL
FENTANYL, URINE: NEGATIVE pg/mL
MDMA Screen, Urine: NEGATIVE ng/mL
METHADONE SCREEN, URINE: NEGATIVE ng/mL
Meperidine Screen, Urine: NEGATIVE ng/mL
Nitrite Urine, Quantitative: NEGATIVE ug/mL
OXYCODONE+OXYMORPHONE UR QL SCN: NEGATIVE ng/mL
Opiate Scrn, Ur: NEGATIVE ng/mL
PHENCYCLIDINE QUANTITATIVE URINE: NEGATIVE ng/mL
Ph of Urine: 6.8 (ref 4.5–8.9)
Propoxyphene Scrn, Ur: NEGATIVE ng/mL
SPECIFIC GRAVITY: 1.015
TAPENTADOL, URINE: NEGATIVE ng/mL
Tramadol Screen, Urine: NEGATIVE ng/mL

## 2015-10-25 LAB — PLEASE NOTE

## 2015-10-25 LAB — ETHYL GLUCURONIDE, URINE: ETHYL GLUCURONIDE SCREEN, UR: NEGATIVE ng/mL

## 2015-10-26 ENCOUNTER — Other Ambulatory Visit (HOSPITAL_COMMUNITY): Payer: 59 | Admitting: Psychology

## 2015-10-26 ENCOUNTER — Encounter (HOSPITAL_COMMUNITY): Payer: Self-pay | Admitting: Psychology

## 2015-10-26 DIAGNOSIS — F1020 Alcohol dependence, uncomplicated: Secondary | ICD-10-CM

## 2015-10-26 DIAGNOSIS — K7010 Alcoholic hepatitis without ascites: Secondary | ICD-10-CM

## 2015-10-27 ENCOUNTER — Other Ambulatory Visit (HOSPITAL_COMMUNITY): Payer: 59

## 2015-10-30 ENCOUNTER — Encounter (HOSPITAL_COMMUNITY): Payer: Self-pay | Admitting: Psychology

## 2015-10-30 ENCOUNTER — Other Ambulatory Visit (HOSPITAL_COMMUNITY): Payer: 59 | Admitting: Psychology

## 2015-10-30 DIAGNOSIS — F1020 Alcohol dependence, uncomplicated: Secondary | ICD-10-CM
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
## 2015-10-31 ENCOUNTER — Encounter (HOSPITAL_COMMUNITY): Payer: Self-pay | Admitting: Psychology

## 2015-10-31 ENCOUNTER — Other Ambulatory Visit (HOSPITAL_COMMUNITY): Payer: 59
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
## 2015-11-01 ENCOUNTER — Other Ambulatory Visit (HOSPITAL_COMMUNITY): Payer: Self-pay | Admitting: Medical

## 2015-11-01 ENCOUNTER — Encounter (HOSPITAL_COMMUNITY): Payer: Self-pay | Admitting: Psychology

## 2015-11-01 ENCOUNTER — Other Ambulatory Visit (HOSPITAL_COMMUNITY): Payer: 59 | Admitting: Psychology

## 2015-11-01 DIAGNOSIS — F1020 Alcohol dependence, uncomplicated: Secondary | ICD-10-CM

## 2015-11-01 DIAGNOSIS — K7010 Alcoholic hepatitis without ascites: Secondary | ICD-10-CM

## 2015-11-01 NOTE — Progress Notes (Signed)
    Daily Group Progress Note  Program: CD-IOP   Group Time: 1-2 pm  Participation Level: Active  Behavioral Response: Appropriate and Sharing  Type of Therapy: Process Group  Topic: Process: the first 60 minutes of group today was spent in process. Members were asked to briefly recount what they had done to support their sobriety since yesterday afternoon's group session. The new group member arrived about 30 minutes late, but apologized when he arrived. He had phoned and left message about late arrival time.   Group Time: 2:15-4 pm  Participation Level: Active  Behavioral Response: Sharing  Type of Therapy: Psycho-education Group  Topic: Psycho-Ed: Family Sculpture/Graduation. The remainder of the session was spent in a psycho-ed on 'Family Sculpture'. This was a continuation of yesterday's exercise. A member volunteered and used his fellow group members to represent his own family members. Two other members provided their own family sculptures and both were powerful and generated good discussion. As the session neared an end, a graduation ceremony was held honoring a member who was completing the program successfully today. Kind words were shared and the patient expressed gratitude and appreciation to the group for the support he had received while here in the program.   Summary: The patient attended 2 AA meetings since our session yesterday. He had attended the "[employer]", which he regards as a great meeting last night and encouraged other members to join him there. The topic had been 'humility' and it had been a good discussion that followed. He had completed a job interview on the computer this morning. Yesterday he had gone to visit his 126 yo daughter after group and before the meeting. Another member pointed out how much time he spends with her throughout the week. The patient volunteered to 'sculpt' his family in the psycho-ed and the picture was of a very disengaged and  distant family. The patient insisted that he had not really been hurt by his parent's disinterest, but pointed out that he spent as much time as possible with his grandmother who did nurture and love him deeply. The patient shared words of hope and encouragement with the graduating member. He continues to make substantial progress in his recovery and the patient responded well to this intervention. His sobriety date remains 4/22.   Family Program: Family present? No   Name of family member(s):   UDS collected: No Results: all drug tests for this patient have been negative  AA/NA attended?: YesWednesday and Thursday  Sponsor?: Yes   Vu Liebman, LCAS

## 2015-11-02 ENCOUNTER — Other Ambulatory Visit (HOSPITAL_COMMUNITY): Payer: 59 | Admitting: Psychology

## 2015-11-02 ENCOUNTER — Encounter (HOSPITAL_COMMUNITY): Payer: Self-pay | Admitting: Psychology

## 2015-11-02 DIAGNOSIS — F1020 Alcohol dependence, uncomplicated: Secondary | ICD-10-CM

## 2015-11-03 ENCOUNTER — Other Ambulatory Visit (HOSPITAL_COMMUNITY): Payer: 59

## 2015-11-03 ENCOUNTER — Encounter (HOSPITAL_COMMUNITY): Payer: Self-pay | Admitting: Psychology

## 2015-11-03 LAB — PRESCRIPTION ABUSE MONITORING 17P, URINE
6-ACETYLMORPHINE, URINE: NEGATIVE ng/mL
AMPHETAMINE SCREEN URINE: NEGATIVE ng/mL
BARBITURATE SCREEN URINE: NEGATIVE ng/mL
BENZODIAZEPINE SCREEN, URINE: NEGATIVE ng/mL
BUPRENORPHINE, URINE: NEGATIVE ng/mL
CANNABINOIDS UR QL SCN: NEGATIVE ng/mL
Carisoprodol/Meprobamate, Ur: NEGATIVE ng/mL
Cocaine (Metab) Scrn, Ur: NEGATIVE ng/mL
Creatinine(Crt), U: 36.4 mg/dL (ref 20.0–300.0)
EDDP, Urine: NEGATIVE ng/mL
FENTANYL, URINE: NEGATIVE pg/mL
MDMA SCREEN, URINE: NEGATIVE ng/mL
Meperidine Screen, Urine: NEGATIVE ng/mL
Methadone Screen, Urine: NEGATIVE ng/mL
NITRITE URINE, QUANTITATIVE: NEGATIVE ug/mL
OXYCODONE+OXYMORPHONE UR QL SCN: NEGATIVE ng/mL
Opiate Scrn, Ur: NEGATIVE ng/mL
PH UR, DRUG SCRN: 6.9 (ref 4.5–8.9)
PROPOXYPHENE SCREEN URINE: NEGATIVE ng/mL
Phencyclidine Qn, Ur: NEGATIVE ng/mL
SPECIFIC GRAVITY: 1.006
TAPENTADOL, URINE: NEGATIVE ng/mL
TRAMADOL SCREEN, URINE: NEGATIVE ng/mL

## 2015-11-03 LAB — ETHYL GLUCURONIDE, URINE: Ethyl Glucuronide Screen, Ur: NEGATIVE ng/mL

## 2015-11-03 NOTE — Progress Notes (Signed)
Christopher Reid is a 35 y.o. male patient. CD-IOP Individual Counseling Session: Patient presented for individual counseling session directly after group therapy from 4:15-5:05pm. Patient was active and engaged in session. Counselor used open questions to allow client to express his emotions and thoughts relating to his recovery. Patient shared that he is stressed about the upcoming father's day this weekend since he does not want to have any familial conflict with his daughter's mom and her parents. Pt expressed resentment and bitterness towards his s/o's mom and stepfather who have been "throwing his addiction in his face". Despite having nearly 60 days sobriety, patient expressed that he is still insecure and feels unsure of his ability to maintain recovery. Counselor validated the pt's feelings and focused his emotion on his pain and hurt that his addiction had caused him. Patient became slightly misty eyed when discussing his insecurity about his recovery and his "inadequacy". Patient and counselor discussed plans to help pt communicate his needs and desires for father's day weekend to his s/o. Counselor encouraged pt to be clear and direct with his needs to "communicate to others how he feels and not assume they know". Patient agreed. Patient showed signs of incongruence when he frequently chuckled at the pain and bitterness he felt towards his s/o and her family. Patient reported that he is getting more in touch with his mood and enjoying going to [employer] meetings everyday and talking to his sponsor. Patient shared that he feels better that the counselor was not taking notes since it showed the counselor "was more confident". Christopher Reid, Counselor        Christopher Reid, LCAS

## 2015-11-06 ENCOUNTER — Other Ambulatory Visit (HOSPITAL_COMMUNITY): Payer: 59 | Admitting: Psychology

## 2015-11-06 ENCOUNTER — Encounter (HOSPITAL_COMMUNITY): Payer: Self-pay | Admitting: Psychology

## 2015-11-06 DIAGNOSIS — K7010 Alcoholic hepatitis without ascites: Secondary | ICD-10-CM

## 2015-11-06 DIAGNOSIS — F1020 Alcohol dependence, uncomplicated: Secondary | ICD-10-CM

## 2015-11-06 NOTE — Progress Notes (Signed)
    Daily Group Progress Note  Program: CD-IOP   Group Time: 1-2:30 pm  Participation Level: Active  Behavioral Response: Appropriate and Sharing  Type of Therapy: Process Group  Topic: Process; the first half of group was spent in process. After check-in, a brief meditation was held, with members mindfully focusing on their breath while listening to a meditation from [email or web address]/. Members shared about challenges and struggles in early recovery and what they had done to strengthen their recovery. Random drug tests were collected and the medical director met with 2 group members during the session.   Group Time: 2:45-4 pm  Participation Level: Active  Behavioral Response: Appropriate and Sharing  Type of Therapy: Psycho-education Group  Topic: Psycho-Ed. The second half of group was spent in a psycho-ed. The topic was the "Neurobiology of Addiction". The session focused on educating group members on the biological nature of their addictions and the source of the biology to be found in their brain chemistry. A brief video was shown explaining this chemical imbalance and members expressed relief and a sense to acceptance now that they have a better understanding of how they got here. The discussion was lively with valuable feedback and disclosure among group members.  Summary: The patient reported he had attended 4 AA meetings since the group last met. One of the meetings, a speaker meeting, had proven particularly empowering for him. The patient reported he had gone to the meeting early to help set up and meet other recovering alcoholics and he had stayed late, after the meeting, to help more. One of the meeting's topics had been "The Promises" and the patient admitted he looks forward to experiencing them firsthand. The patient was very attentive during the presentation and video on addiction. He found it very comforting to know that it is not a character defect. He has been honest about  feeling badly about himself and even wondered if the fact that he was a recovering alcoholic would be viewed badly by the legal system if there were ever any custody issues with his daughter. The patient continues to make substantial progress in early recovery and has done everything we have asked of him and even more.  Another group member commented on the level of 'commitment' this member displays. He provided helpful feedback and responded well to this intervention. His sobriety date remains 4/22.   Family Program: Family present? No   Name of family member(s):   UDS collected: No Results:   AA/NA attended?: YesMonday, Tuesday and Wednesday  Sponsor?: Yes   Edsel Shives, LCAS

## 2015-11-06 NOTE — Progress Notes (Signed)
    Daily Group Progress Note  Program: CD-IOP   Group Time: 1-2:30  Participation Level: Active  Behavioral Response: Appropriate and Sharing  Type of Therapy: Process Group  Topic: Counselors met with patients for group process session. Patients discussed their recovery from mind-altering drugs and alcohol. All patients were active and engaged in session. One patient graduated from the program.     Group Time: 2:45-4  Participation Level: Active  Behavioral Response: Appropriate and Sharing  Type of Therapy: Psycho-education Group  Topic: Counselors met with patients for group psychoeducation session. Counselors taught group about addiction and the brain. Patients strategized about external and internal triggers that cause them to use. Counselors helped patients identify their individual triggers.   Summary: Sobriety date remains 4/22 Patient presented for group counseling today from 1-4pm. Patient was active and engaged in session. Patient reported that he attended 1 AA meeting and that he his pursuing the 4th step with his sponsor. Patient reported that he is feeling his "emotions more" and noticing that his mood is more labial than he could recall when he was drinking. Counselor reflected that he is feeling things strongly since his brain is healing. Patient shared that he was excited about the upcoming father's day weekend and was hoping to spend time with his daughter. Youlanda Roys, Counselor   Family Program: Family present? No   Name of family member(s):   UDS collected: No Results: negative  AA/NA attended?: YesWednesday and Thursday  Sponsor?: Yes   Tamea Bai, LCAS

## 2015-11-07 ENCOUNTER — Other Ambulatory Visit (HOSPITAL_COMMUNITY): Payer: 59

## 2015-11-08 ENCOUNTER — Other Ambulatory Visit (HOSPITAL_COMMUNITY): Payer: 59 | Admitting: Psychology

## 2015-11-08 ENCOUNTER — Encounter (HOSPITAL_COMMUNITY): Payer: Self-pay | Admitting: Psychology

## 2015-11-08 DIAGNOSIS — F1020 Alcohol dependence, uncomplicated: Secondary | ICD-10-CM

## 2015-11-08 NOTE — Progress Notes (Signed)
Daily Group Progress Note  Program: CD-IOP   Group Time: 1-2:30 pm  Participation Level: Active  Behavioral Response: Sharing  Type of Therapy: Process Group  Topic: Process:  The first part of group was spent in process. Members shared about what they had done over the weekend to support their recovery. They also identified any challenges or temptations that may have presented themselves since we last met. One member checked-in with a new sobriety date of today. He proceeded to share the events of the weekend, including a return to use on Friday and then again on Sunday. He was filled with remorse as he described these events.  Much of the remainder of this session was spent processing the relapses. The medical director met with 2 group members today and random drug tests were collected from 6 group members.   Group Time: 2:45- 4pm  Participation Level: Active  Behavioral Response: Appropriate  Type of Therapy: Psycho-education Group  Topic: Psycho-Ed: Relapse Prevention. The second half of group was spent in a psycho-ed. it included a utube video about Baclofen, which reduces cocaine cravings. After discussing the video, group members were given a handout entitled, "Be Smart, Not Strong" from the [employer] Manual. It followed on the importance of avoiding triggers and potentially dangerous relapse situations. The focus was not on using willpower, which doesn't work, but setting one's self up for Sobriety.   Summary: The patient reported a busy weekend. He had worked on Friday and Saturday and spent time with his daughter at the pool. The patient reported he had attended 5 AA meetings since we last met and noted he had seen another member at a [employer]. he agreed that it had been a very motivating powerful speaker. The patient reported his daughter had spent the night with him on Friday evening. This was the first time in over 2 years that she had stayed with him alone.  The group applauded this news and it was a significant indicator that he is gaining the trust of her mother. He laughed and reported he had sat next to a former group member in one [place] meeting. She had graduated a few weeks ago, but it had been good to see her and she was doing well. Later in the evening, he received a text from his sponsor saying he had heard that the patient was there with his mother. The patient expressed a sense of responsibility about his fellow group member's relapse. He shared that he had sent a text to this fellow on Friday around 7:15 pm. He was surprised and worried when there was no response from him, because he always responded to text messages in the past. The patient admitted he experienced a bad freeing and felt this was not a good sign. He encouraged this young man to just plug right back into his recovery and validated the strength and courage it took to return to group today even though he felt very sick. During the psycho-ed, the patient and reported that boredom is something he has to avoid because it is a big trigger. Having a sponsor to phone is a valuable help for him and he speaks with him daily. This patient continues to make measurable progress in early recovery and he provides excellent feedback in group sessions. He responded well to this intervention and his sobriety date remains 4/22.   Family Program: Family present? No   Name of family member(s):   UDS collected: Yes Results: negative  AA/NA attended?: [place], Thursday, Friday, Saturday and Sunday  Sponsor?: Yes   Aisea Bouldin, LCAS

## 2015-11-09 ENCOUNTER — Other Ambulatory Visit (HOSPITAL_COMMUNITY): Payer: 59 | Admitting: Psychology

## 2015-11-09 ENCOUNTER — Encounter (HOSPITAL_COMMUNITY): Payer: Self-pay | Admitting: Psychology

## 2015-11-09 DIAGNOSIS — K7010 Alcoholic hepatitis without ascites: Secondary | ICD-10-CM

## 2015-11-09 DIAGNOSIS — F1020 Alcohol dependence, uncomplicated: Secondary | ICD-10-CM

## 2015-11-09 NOTE — Progress Notes (Signed)
    Daily Group Progress Note  Program: CD-IOP   Group Time: 1-2:30  Participation Level: Active  Behavioral Response: Appropriate and Sharing  Type of Therapy: Process Group  Topic: Counselors met with patients for group process session. Patients discussed their recovery from mind-altering drugs and alcohol. Patients were active and engaged. One patient was excused absent for a prescheduled vacation. Drug test was collected from one patient who missed group on Monday. Patients briefly checked in with their sobriety dates and any challenges they had faced since last meeting in CD-IOP. 2 Patients met with Investment banker, operational.     Group Time: 2:45-4  Participation Level: Active  Behavioral Response: Appropriate and Sharing  Type of Therapy: Psycho-education Group  Topic: Counselors met with patients for group psychoeducation session. The topic included relapse prevention techniques and developing a relapse prevention plan. Counselor showed a video describing the 3 stages of relapse: emotional, mental, and physical. Patients were encouraged to think critically about their experiences with relapsing and how they could avoid their triggers before they are physically tempted to use.   Summary: Sobriety date remains 4/22 Patient presented for group counseling today from 1-4pm. Patient was active and engaged in group and had an upbeat affect. Patient shared that he attended 4 AA meetings. He reported that he was feeling conflicted since his daughter expressed frustration that he was not able to spend more time with her. Moreover, he was happy that he "felt wanted and loved by his daughter". Patient picked up his 60 day chip. Patient shared that he felt like he drank to "fill a void". He is making good progress in his recovery. Counselor asked if patient would be able to have a couples session with his daughter's mom and he said that he was "not ready to be rejected by her". Counselor clarified  that they could discuss a goal of helping him be a "better father" and not focus on his relationship with his s/o. Patient said that "could work". Youlanda Roys, Counselor   Family Program: Family present? NA   Name of family member(s):   UDS collected: No Results: negative  AA/NA attended?: YesMonday, Tuesday and Wednesday  Sponsor?: Yes   Eldin Bonsell, LCAS

## 2015-11-10 ENCOUNTER — Encounter (HOSPITAL_COMMUNITY): Payer: Self-pay | Admitting: Psychology

## 2015-11-10 ENCOUNTER — Other Ambulatory Visit (HOSPITAL_COMMUNITY): Payer: 59

## 2015-11-10 NOTE — Progress Notes (Unsigned)
Adair PatterWilliam C Kobler is a 35 y.o. male patient. CD-IOP Individual Counseling Session: Patient presented for individual counseling directly after group therapy, from 4-5pm. Patient was active and engaged in session. Patient shared about his desire to get a new job that would be full-time and allow him to financially provide for his daughter and s/o. Counselor and patient discussed strategies for job interview techniques and online resources.        Pieper Kasik, LCAS

## 2015-11-13 ENCOUNTER — Other Ambulatory Visit (HOSPITAL_COMMUNITY): Payer: 59 | Admitting: Psychology

## 2015-11-13 ENCOUNTER — Encounter (HOSPITAL_COMMUNITY): Payer: Self-pay | Admitting: Psychology

## 2015-11-13 DIAGNOSIS — F1020 Alcohol dependence, uncomplicated: Secondary | ICD-10-CM

## 2015-11-13 DIAGNOSIS — K7010 Alcoholic hepatitis without ascites: Secondary | ICD-10-CM

## 2015-11-13 NOTE — Progress Notes (Signed)
Daily Group Progress Note  Program: CD-IOP   Group Time: 1-2:30 pm  Participation Level: Active  Behavioral Response: Appropriate and Sharing  Type of Therapy: Process Group  Topic: Process: The first half of group was spent in process. Members shared about current issues and concerns that they are facing in early recovery. The disclosures were compelling and members were engaged and active in the session. One member reported he had gone into his car, where he used to use frequently, and found a "torch" (a lighter) that he had used to smoke drugs. It had stirred up some powerful emotion and he had sought out others in recovery to reassure him. Drug test results were returned to group members and everyone had reportedly remained abstinent since we last met.   Group Time: 2:45-4pm  Participation Level: Active  Behavioral Response: Appropriate and Sharing  Type of Therapy: Psycho-education Group  Topic: Psycho-Ed: Living Life on Life's Terms/Serenity Prayer; the second half of group was spent in process. Members were asked to explain what the "Living Life.." means to them. While some articulated its meaning quite succinctly, other members really didn't understand how this might relate to their recovery. This led into a discussion on the Serenity Prayer with members completing a handout asking them to identify what they can and cannot change. This session was intense and challenged members to examine their lives and perspective very seriously.   Summary: Sobriety Date = 4/22. The patient reported that after the group session yesterday, he had gone home. At home, he looked around the house and noticed that most of the pictures displayed around the house were of his ex-S/O and her family (he doesn't care for her parents). "None of them were of me or my daughter", the patient recognized. He decided he was going to make some changes so he spent the next couple of hours taking apart frames and  pictures and replaced those with ones that were of him and his family and even his grandparents. The patient admitted he just hadn't noticed these things. The counselor pointed out that he is waking up to his life and he admitted, I've been in my house, but just haven't "lived" there. The patient reported he had phoned his sponsor last night, but had decided he wouldn't go to a meeting tonight and instead, watched a little TV and then went to bed. He was up early to get to work at 6 am and went home for lunch and completed a phone interview. In the discussion during the psycho-ed, the patient described the misery of his last 2 years of drinking. It included dry-heaving every morning and just watching the clock so he could get alcohol in his system at the end of his work day. He pointed out that he couldn't eve n hold a coffee cup until around 3 in the afternoon and how he tried to avoid doing any paperwork in the presence of others, because he couldn't write he was so shaky. When asked about his answers to the [place], the patient reported he couldn't change his family, other people or the past. The patient pointed out that he can change his attitudes, his relationships with other, including his daughter and her mother. He can also insure that he doesn't become complacent about his chronic illness. The patient made some excellent comments and he responded well to this intervention. He continues to make substantial progress in early recovery.    Family Program: Family present? No  Name of family member(s):   UDS collected: No Results:   AA/NA attended?: No  Sponsor?: Yes   Darnise Montag, LCAS

## 2015-11-15 ENCOUNTER — Other Ambulatory Visit (HOSPITAL_COMMUNITY): Payer: 59 | Admitting: Psychology

## 2015-11-15 DIAGNOSIS — F1020 Alcohol dependence, uncomplicated: Secondary | ICD-10-CM

## 2015-11-16 ENCOUNTER — Other Ambulatory Visit (HOSPITAL_COMMUNITY): Payer: 59 | Admitting: Psychology

## 2015-11-16 ENCOUNTER — Encounter (HOSPITAL_COMMUNITY): Payer: Self-pay | Admitting: Psychology

## 2015-11-16 DIAGNOSIS — F1020 Alcohol dependence, uncomplicated: Secondary | ICD-10-CM

## 2015-11-17 ENCOUNTER — Encounter (HOSPITAL_COMMUNITY): Payer: Self-pay | Admitting: Psychology

## 2015-11-17 NOTE — Progress Notes (Signed)
    Daily Group Progress Note  Program: CD-IOP   Group Time: 1-2:30 pm  Participation Level: Active  Behavioral Response: Appropriate and Sharing  Type of Therapy: Process Group  Topic: Process: the first part of group was spent in process. Members shared about the past weekend and any challenges to their sobriety was discussed. A member returned after missing the last 3 group sessions on a long-ago scheduled vacation. She talked about her experiences. During group today, the Market researcher met with the member scheduled to graduate today. Five random drug tests were collected today.  Group Time: 2:45-4pm  Participation Level: Active  Behavioral Response: Appropriate  Type of Therapy: Psycho-ed  Topic: Psycho-Ed; Communication/Graduation: the second half of group was spent in a psycho-ed on 'Communication'. Members were provided a handout and asked to split up into dyads and sit with their backs to each other. Then one of the two people began to describe an image for the other person and he/she were asked to draw the image based on the verbal description. At the end of this session, members provided each other feedback about how they might have explained things more clearly. A discussion on communication and some of the most essential elements of communication identified by members. With 20 minutes remaining, the session was halted and a graduation ceremony observed. The patient's husband had come to visit and be present for his wife's graduation. Kind and encouraging words of hope and encouragement were shared with the graduating member and a few tears were shed by her.   Summary: Sobriety date = 4/22. The patient reported he had attended 7 AA meetings since we last met. For some reason, most of the meetings he had attended were speaker's meetings. The patient noted he had met with his counselor and they had talked about his future and how he intends to remain connected to his daughter.  He agreed that he is learning to be around his former S/O and daughter being sober. He had worked and it was busy on Friday and Saturday. At the Saturday meeting his sponsor arranged for him to give out the chips and although it was a new experience for him, the patient reported he had enjoyed it. The topic had been 'Acceptance' and there were many good comment and observations about this most important topic in recovery. During the psycho-end, the patient read the script to his fellow group member. Later, when sked to describe how he had done, she described him as being "patient and speaking very slowly'. The patient identified excellent communication as being 'clear and to the point'. He had kind words to the graduating member and wished her well. He encouraged her to continue with AA meetings here in [place] and stated he would look for her in the rooms. The patient provided helpful feedback and encouragement during the discussion and he responded well to this intervention.   Family Program: Family present? No  Name of family member(s):   UDS collected: No Results:   AA/NA attended?: Yes, Thursday, Friday, SAturday, Sunday, MOnday  Sponsor?: Yes   Sarahann Horrell, LCAS

## 2015-11-20 ENCOUNTER — Other Ambulatory Visit (HOSPITAL_COMMUNITY): Payer: 59 | Attending: Psychiatry | Admitting: Psychology

## 2015-11-20 DIAGNOSIS — F1020 Alcohol dependence, uncomplicated: Secondary | ICD-10-CM

## 2015-11-20 DIAGNOSIS — F10230 Alcohol dependence with withdrawal, uncomplicated: Secondary | ICD-10-CM | POA: Insufficient documentation

## 2015-11-22 ENCOUNTER — Other Ambulatory Visit (HOSPITAL_COMMUNITY): Payer: Self-pay | Admitting: Medical

## 2015-11-22 ENCOUNTER — Encounter (HOSPITAL_COMMUNITY): Payer: Self-pay | Admitting: Psychology

## 2015-11-22 ENCOUNTER — Other Ambulatory Visit (HOSPITAL_COMMUNITY): Payer: 59 | Admitting: Psychology

## 2015-11-22 ENCOUNTER — Telehealth (HOSPITAL_COMMUNITY): Payer: Self-pay | Admitting: Psychology

## 2015-11-22 DIAGNOSIS — F1020 Alcohol dependence, uncomplicated: Secondary | ICD-10-CM

## 2015-11-22 DIAGNOSIS — K7010 Alcoholic hepatitis without ascites: Secondary | ICD-10-CM

## 2015-11-22 NOTE — Progress Notes (Signed)
Christopher Reid is a 35 y.o. male patient.  CD-IOP INDIVIDUAL COUNSELING SESSION: Patient presented for counseling from 4-5pm directly after group therapy. Patient presented as active, engaged, and agitated in session. Patient began session checking in about his recent decision to break his routine and miss AA for 2 days in a row. Patient used Motivational Interviewing to evoke client motivation for his behavior. Patient revealed that he thinks "he earned the right not go". Counselor agreed that he had been working very hard. Patient shared that he talked with his dad and an old friend from college who both drink and "are able to handle it". Patient shared that he found himself wondering why he could not handle alcohol. Patient explored his desire to drink and his experience of an active craving for alcohol while in session. Counselor used a variety of techniques to help patient explore his here-and-now experiencing of his craving and gain awareness so he can work to get through it. Patient shared that he had a plan for how he could get drunk that night and that he knew "where he would park and which [employer] store he would drive to". Patient shared that he now felt actively like he could control his drinking which he acknowledged was "his addiction talking". Counselor reflected that patient's addiction was presenting a strong craving for him to fight and that this kind of thinking was dangerous since he knows he cannot use successfully. Patient and counselor came up with a strategy to keep patient sober for that night. Patient would call his sponsor immediately upon leaving session and drive straight to an AA meeting followed by attending another AA meeting. Counselor asked patient to call his office phone in the morning to "check in" with a status report. Patient agreed. Patient expressed that he was fearful that he thought he could drink successfully and shared that his "heart was racing during session" since  he was nervous. Counselor reflected that it is wise to "tell on himself" and get out his feelings of anger and frustration with recovery. Dorann LodgeWes Swan, Counselor.      Braleigh Massoud, LCAS

## 2015-11-22 NOTE — Progress Notes (Signed)
    Daily Group Progress Note  Program: CD-IOP   Group Time: 1-2:30  Participation Level: Active  Behavioral Response: Appropriate and Sharing  Type of Therapy: Process Group  Topic: Counselors met with patients for group process session. Patients discussed their recovery from mind-altering drugs and alcohol. Patients were active and engaged. Counselor used open questions and Motivational Interviewing to help patients develop a stronger sense of motivation. Patients responded well to interventions and encouraged each other in recovery. Patients discussed Fourth of July plans.      Group Time: 2:45-4  Participation Level: Active  Behavioral Response: Appropriate and Sharing  Type of Therapy: Psycho-education Group  Topic: Counselors met with patients for group psychoeducation session. The topic included emotions and vulnerability. Counselor showed a TED talk by Almond Lint, Social Worker and educator who speaks on the power of vulnerability. Patients shared their interpretations of the video and discussed the difference between vulnerability and weakness.    Summary: Sobriety date remains 4/22. Patient presented for group counseling today and was active and engaged in session. He shared that he had had a "rough Thursday night after our last group" and revealed that he had an active plan to drink but was able to avoid a relapse thanks to his admitting his feelings of wanting to control his drinking and explore his feelings using MI with his counselor. Patient reported that he went to 8 AA meetings over the weekend which helped him to recover from an intense episode of craving on Thursday night. Patient spoke with his sponsor daily and worked Friday and Saturday. He shared that work was somewhat slow due to the holiday weekend which he enjoyed. Patient is making great progress in his early recovery and shows strong signs that he will be able to maintain sobriety after leaving CD-IOP.  Patient continues to be disclosing about his cravings and open to sharing his emotions in group. Patient does not discuss his relationship with his daughter's mother since he fears she may not want to get back together long-term. Patient shared about his pursuing a new job which will pay more and give him benefits. Patient is doing well in tx and plans to graduate next week. Youlanda Roys, Counselor.   Family Program: Family present? No   Name of family member(s):   UDS collected: No Results: negative  AA/NA attended?: YesThursday, Friday, Saturday and Sunday  Sponsor?: Yes   Hillery Zachman, LCAS

## 2015-11-22 NOTE — Progress Notes (Signed)
    Daily Group Progress Note  Program: CD-IOP   Group Time: 1-2:30  Participation Level: Active  Behavioral Response: Appropriate and Sharing  Type of Therapy: Process Group  Topic: Counselors met with patients for group process session. Patients discussed their recovery from mind-altering drugs and alcohol. Patients were active and engaged. Counselor used open questions and Motivational Interviewing to help patients develop a stronger sense of motivation. Patients responded well to interventions and encouraged each other in recovery.     Group Time: 2:45-4  Participation Level: Active  Behavioral Response: Appropriate and Sharing  Type of Therapy: Psycho-education Group  Topic: Counselors met with patients for group psychoeducation session. The topic included emotions and feelings. Counselor led group game designed to elicit group interaction and discussion of feelings. Patients reported that they enjoyed the game and felt that it led to discussion on anger, resentment, bitterness, joy, and peace. Patients were active and engaged in psychoeducation session.    Summary: Sobriety date remains 4/22. Patient presented for group today and was active and engaged in session. He reported that he has not been to AA in over 48 hours. Patient discussed his opinion that he "needs a break" from [place]. Counselor used Motivational Interviewing to explore clients motivation to miss meetings. Patient shared that he was trying to "not beat himself up for wanting to sleep past 7am". Patient shared that he had spent time with his daughter and was feeling good about that. He later revealed that he was feeling uncomfortable that he had missed so many AA meetings and was planning to go to 2 that night. Patient seemed visibly ambivalent and distraught over his decision to miss recent [place] meetings. Counselor reflected that perhaps he was missing meetings because he was feeling bad about something lately. Patient  replied that he was feeling good and did not see why that would contribute to his absence from AA. Patient shared his feelings openly during the psychoeducational game and was able to engage others in their feelings as well. Patient is doing well in his recovery and gaining awareness of his needs in early recovery. Youlanda Roys, Counselor.   Family Program: Family present? No   Name of family member(s):   UDS collected: No Results: negative  AA/NA attended?: No  Sponsor?: Yes   Sumit Branham, LCAS

## 2015-11-22 NOTE — Progress Notes (Signed)
    Daily Group Progress Note  Program: CD-IOP   Group Time: 1-2:30  Participation Level: Active  Behavioral Response: Appropriate and Sharing  Type of Therapy: Process Group  Topic: Clinician facilitated check-in regarding current stressors and situation, and progress on recovery. Clinician utilized active listening, validation, and empathetic response.  Clinician introduced topic of communication, specifically the types of communication and refusal skills,  and led discussion and activity based on this topic. Clinician assessed for immediate needs and safety concerns.       Group Time: 2:45-4  Participation Level: Active  Behavioral Response: Appropriate and Sharing  Type of Therapy: Psycho-education Group  Topic: Clinician facilitated check-in regarding current stressors and situation, and progress on recovery. Clinician utilized active listening, validation, and empathetic response.  Clinician introduced topic of communication, specifically the types of communication and refusal skills,  and led discussion and activity based on this topic. Clinician assessed for immediate needs and safety concerns.     Summary: Patient reports attending meetings, spending time with his daughter, and engaging in introspection as ways he is working on his recovery. Patient reports understanding of topic and related it to personal experience, recognizing ways to improve. Patient was encouraging and supportive of other group members. Patient denies SI/HI/self harm thoughts and immediate needs.    Family Program: Family present? No   Name of family member(s):   UDS collected: No Results: negative  AA/NA attended?: YesTuesday and Wednesday  Sponsor?: Yes   Duante Arocho, LCAS

## 2015-11-23 ENCOUNTER — Other Ambulatory Visit (HOSPITAL_COMMUNITY): Payer: 59 | Admitting: Psychology

## 2015-11-23 ENCOUNTER — Encounter (HOSPITAL_COMMUNITY): Payer: Self-pay | Admitting: Psychology

## 2015-11-23 DIAGNOSIS — F1020 Alcohol dependence, uncomplicated: Secondary | ICD-10-CM

## 2015-11-23 DIAGNOSIS — K7010 Alcoholic hepatitis without ascites: Secondary | ICD-10-CM

## 2015-11-24 ENCOUNTER — Encounter (HOSPITAL_COMMUNITY): Payer: Self-pay | Admitting: Psychology

## 2015-11-24 LAB — PRESCRIPTION ABUSE MONITORING 17P, URINE
6-ACETYLMORPHINE, URINE: NEGATIVE ng/mL
Amphetamine Scrn, Ur: NEGATIVE ng/mL
BARBITURATE SCREEN URINE: NEGATIVE ng/mL
BENZODIAZEPINE SCREEN, URINE: NEGATIVE ng/mL
BUPRENORPHINE, URINE: NEGATIVE ng/mL
CANNABINOIDS UR QL SCN: NEGATIVE ng/mL
CARISOPRODOL/MEPROBAMATE, UR: NEGATIVE ng/mL
COCAINE(METAB.)SCREEN, URINE: NEGATIVE ng/mL
CREATININE(CRT), U: 43.8 mg/dL (ref 20.0–300.0)
EDDP, Urine: NEGATIVE ng/mL
Fentanyl, Urine: NEGATIVE pg/mL
MDMA SCREEN, URINE: NEGATIVE ng/mL
Meperidine Screen, Urine: NEGATIVE ng/mL
Methadone Screen, Urine: NEGATIVE ng/mL
Nitrite Urine, Quantitative: NEGATIVE ug/mL
OXYCODONE+OXYMORPHONE UR QL SCN: NEGATIVE ng/mL
Opiate Scrn, Ur: NEGATIVE ng/mL
PHENCYCLIDINE QUANTITATIVE URINE: NEGATIVE ng/mL
Ph of Urine: 5.8 (ref 4.5–8.9)
Propoxyphene Scrn, Ur: NEGATIVE ng/mL
SPECIFIC GRAVITY: 1.01
Tapentadol, Urine: NEGATIVE ng/mL
Tramadol Screen, Urine: NEGATIVE ng/mL

## 2015-11-24 LAB — ETHYL GLUCURONIDE, URINE: Ethyl Glucuronide Screen, Ur: NEGATIVE ng/mL

## 2015-11-24 NOTE — Progress Notes (Signed)
Christopher Reid is a 35 y.o. male patient. CD-IOP INDIVIDUAL COUNSELING SESSION: Patient presented for counseling directly after group therapy, from 4:15-5:05 PM. Patient was active and engaged in session with upbeat and aggressive affect. since he had just disclosed in group about a conflicting situation with his roommate/ex-girlfriends' father. Patient shared in individual that he felt himself get angry during group and he does not like that his roommate will not "get the hint to leave on his own" after 4 years of living with the patient rent-free. Counselor asked about patient's form of communication with roommate, which pt replied is mostly passive aggressive and non-existent. Patient reported that he often will hope roommate "gets a hint" but does not openly and clearly communicate his needs to his roommate. Patient stated that he hopes to have his roommate out of his house by the end of 2017 so patient can have more freedom with his daughter when she comes to visit. Counselor used feedback to help patient discover the ways he prevents himself from getting what he wants, through avoiding expressing his needs and feeling guilty for asking roommate to leave. Patient agreed and stated that it is not comfortable when roommate "throws a tantrum". Counselor used CBT to ask what really bothers pt about roommate's tantrums. Patient could not state what bothered him other than feeling uncomfortable. Counselor reflected that pt has control over what he chooses to engage in with his roommate and can choose to walk away from an argument that is not productive. Patient agreed. Counselor asked pt what goal he would like to set around this situation. Patient agreed to check in at group on Monday with a status on whether or not he has confronted his roommate about a time line for moving out. Dorann LodgeWes Swan, Counselor       Jolanta Cabeza, LCAS

## 2015-11-27 ENCOUNTER — Encounter (HOSPITAL_COMMUNITY): Payer: Self-pay | Admitting: Psychology

## 2015-11-27 ENCOUNTER — Encounter (HOSPITAL_COMMUNITY): Payer: Self-pay | Admitting: Medical

## 2015-11-27 ENCOUNTER — Other Ambulatory Visit (HOSPITAL_COMMUNITY): Payer: 59 | Admitting: Psychology

## 2015-11-27 DIAGNOSIS — Z720 Tobacco use: Secondary | ICD-10-CM

## 2015-11-27 DIAGNOSIS — F4312 Post-traumatic stress disorder, chronic: Secondary | ICD-10-CM

## 2015-11-27 DIAGNOSIS — K7010 Alcoholic hepatitis without ascites: Secondary | ICD-10-CM

## 2015-11-27 DIAGNOSIS — F1994 Other psychoactive substance use, unspecified with psychoactive substance-induced mood disorder: Secondary | ICD-10-CM

## 2015-11-27 DIAGNOSIS — F1020 Alcohol dependence, uncomplicated: Secondary | ICD-10-CM

## 2015-11-27 NOTE — Progress Notes (Incomplete)
    Daily Group Progress Note  Program: CD-IOP   Group Time: 1-23:30 pm  Participation Level: Active  Behavioral Response: Appropriate and Sharing  Type of Therapy: Process Group  Topic:      Group Time: ***  Participation Level: {CHL AMB BH Group Participation:21022742}  Behavioral Response: {CHL AMB BH Group Behavior:21022743}  Type of Therapy: {CHL AMB BH Type of Therapy:21022741}  Topic: ***   Summary: ***   Family Program: Family present? {BHH YES OR NO:22294}   Name of family member(s): ***  UDS collected: {BHH YES OR NO:22294} Results: {Findings; urine drug screen:60936}  AA/NA attended?: {BHH YES OR NO:22294}{DAYS OF ZOXW:96045}WEEK:22385}  Sponsor?: {BHH YES OR NO:22294}   Turrell Severt, LCAS

## 2015-11-27 NOTE — Progress Notes (Signed)
  [facility] Summary   Adair PatterWilliam C Fairley [id number]  Date of Admission:09/18/2015 Date of Discharge: 11/29/2015  Course of Treatment: [facility] after Hospitalization for impending DTs in April of 2017.From the start he was highly motivated and has complied with all aspects of his treatment program.He was not interested in MAT He has joined AA and established a Home Group with a Marketing executiveponsor. He has reestablished his relationship with his daughter and is in proceass of returning to work in [employer] where he has had success in past.  Goals and Activities to Help Maintain Sobriety: 1. Stay away from old friends who continue to drink and use mind-altering chemicals. 2. Continue practicing Fair Fighting rules in interpersonal conflicts. 3. Continue alcohol and drug refusal skills and call on support systems. 4. Establish PCP and FU on regular basis  Referrals: NA   Aftercare services: CDIOP Aftercare Mondays 5pm 1. Attend AA meetings regularly (4-7/week) 2. Continue with a sponsor and a home group in AA  Next appointment: Aftercare  Plan of Action to Address Continuing Problems: as above    Client has participated in the development of this discharge plan and has received a copy of this completed plan  Maryjean MornCharles Rashanda Magloire  11/27/2015   Maryjean Mornharles Kaeya Schiffer, PA-C 11/27/2015

## 2015-11-28 ENCOUNTER — Encounter (HOSPITAL_COMMUNITY): Payer: Self-pay | Admitting: Psychology

## 2015-11-28 NOTE — Progress Notes (Signed)
    Daily Group Progress Note  Program: CD-IOP   Group Time: 1-2:30  Participation Level: Active  Behavioral Response: Appropriate and Sharing  Type of Therapy: Process Group  Topic: Counselors met with patients for group processing session which focused on patient experience in recovery. Patients were active and engaged in session. Drug tests were collected from multiple patients. Patients were active and engaged in session, discussing their challenges, thoughts, and feelings concerning sobriety.     Group Time: 2:45-4  Participation Level: Active  Behavioral Response: Appropriate and Sharing  Type of Therapy: Psycho-education Group  Topic: Counselors met with patients for group psychoeducation session which utilized a CBT intervention entitled "The ABCD" chart. Patients identified their negative behavioral and emotional consequences to their negative thoughts. Counselor encouraged patients to use real-life examples and dispute their irrational beliefs. Patients responded well to intervention, but it was clear that more work was needed to further the patients' understanding of the topic.   Summary: Patient presented for group counseling today and was active and engaged in session. Patient had upbeat and positive affect and expressed excitement about graduating later this week. He reported that he attended 8 AA meetings and worked frequently at his job over the weekend. Patient expressed frustration towards his job Optometrist used reflection to reveal patient's emotional reaction which was anger and jealousy towards manager for having power over patient. 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 Patient agreed and shared that he also communicated to his roommate that he wants him to move out soon. The group congratulated him on reaching this goal. Patient shared that he  talked to a male in [place] at a social gathering which felt empowering and refreshing to be somewhat romantic with someone for the first time in some time. He also participated significantly during the group psychoeducation session asking specific questions concerning his family conflict and how to avoid emotional distress when others cause him anger. Patient has showed excellent progress while in early recovery and tx and plans to discharge this Thursday. Sobriety date 4/22. Youlanda Roys, Counselor   Family Program: Family present? No   Name of family member(s):   UDS collected: No Results: negative  AA/NA attended?: YesThursday, Friday, Saturday and Sunday  Sponsor?: Yes   Calley Drenning, LCAS

## 2015-11-29 ENCOUNTER — Encounter (HOSPITAL_COMMUNITY): Payer: Self-pay | Admitting: Psychology

## 2015-11-29 ENCOUNTER — Other Ambulatory Visit (HOSPITAL_COMMUNITY): Payer: Self-pay

## 2015-11-29 NOTE — Progress Notes (Signed)
    Daily Group Progress Note  Program: CD-IOP   Group Time: 1-2:30 pm  Participation Level: Active  Behavioral Response: Sharing  Type of Therapy: Process Group  Topic: Process: the first half of group was spent in process. Members shared about the holiday and what they had done to support their sobriety. All members checked-in with the same sobriety dates. Random drug tests were collected from all present today. Also present were 2 PA students from [facility]. The program director met with 2 group members during the session today.   Group Time: 2:45- 4pm  Participation Level: Active  Behavioral Response: Appropriate and Sharing  Type of Therapy: Psycho-education Group  Topic: Psycho-ed/Graduation: the second half of group was spent in a psycho-ed on 'Anger'. A handout was provided and members took turns reading it. A discussion followed on differentiating "Destructive" versus "Constructive" Anger. As the session neared the end, a graduation ceremony was held for a member who was successfully completed the program. Words that included admiration, inspiration and courage were used in describing this man who has been a respected member of the group since he first arrived. The member's wife arrived and was also present and participated in the graduation ceremony.   Summary: Sobriety Date = 4/22. The patient reported he had spoken with his sponsor twice since Monday, but he had not attended any meetings. He felt confident it wasn't a red flag or indications of a problem, but explained that he had worked most of Tuesday and got up early for an interview in [place] this morning. The interview 'went very well'. The patient reported that he had actually 'slept in my bed' Monday evening. He has been sleeping on the couch since he got sober. The bedroom was a big trigger because he spent so much time in that room drinking in isolation. In the psycho-ed, the patient expressed frustration and  anger towards the fellow who lives with him. He understood the difference between constructive and destructive anger, but struggled with how to tell his 'roommate' that he wants him to move out. After 4 years of not paying rent, the patient believes it is time for him to go. But, he admitted being reluctant to address this because he was uncertain just how this fellow might respond. When asked what it mattered, the patient was unable to identify exactly what his fears and hesitation are from, but noted he had gotten mad and threw a tantrum in the past over something he didn't like. The patient briefly role-played with the counselor about this and could say what he was wanting quite clearly. However, even he laughed when sked if he would be able to say that to his face. The patient shared kind words of admiration towards the graduating member and wished him well. This patient responded well to this intervention and despite progress in his recovery, we will continue to work on his communications skills relating to his personal and/or emotional life. 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    Family Program: Family present? No   Name of family member(s):   UDS collected: Yes Results: negative  AA/NA attended?: No  Sponsor?: Yes   Nestor Wieneke, LCAS

## 2015-11-30 ENCOUNTER — Encounter (HOSPITAL_COMMUNITY): Payer: Self-pay | Admitting: Psychology

## 2015-11-30 ENCOUNTER — Other Ambulatory Visit (HOSPITAL_COMMUNITY): Payer: 59 | Admitting: Psychology

## 2015-11-30 DIAGNOSIS — F1020 Alcohol dependence, uncomplicated: Secondary | ICD-10-CM

## 2015-11-30 NOTE — Progress Notes (Signed)
    Daily Group Progress Note  Program: CD-IOP   Group Time: 1-2:30  Participation Level: Active  Behavioral Response: Appropriate and Sharing  Type of Therapy: Process Group  Topic: Counselors met with patients for group process session. Patients shared about their recovery from drugs and alcohol. Patients were active and engaged in group. 2 Patients graduated from group successfully with some family members and AA sponsors in attendance. One group member shared that he had not gone to any meetings in over a week and some group members confronted him about this issue.     Group Time: 2:45-4  Participation Level: Active  Behavioral Response: Appropriate and Sharing  Type of Therapy: Psycho-education Group  Topic: Counselors met with patients for group psychoeducation session. The topic was mindfulness in recovery with an emphasis on developing a non-judgmental stance and ability to be more present with their emotional experience. Counselor used a handout from the DBT skills workbook on developing "wise mind" in recovery. Patients were thoughtfully engaged and showed interest in the subject. Some patients felt overwhelmed by the "depth" of the subject but counselor encouraged them to practice mindfulness in simple terms.    Summary: Patient was active and engaged for his last group. He became significantly tearful during graduation ceremony and was unable to verbalize his gratitude towards the group in detail. Patient reported during check in that he attended 4 AA meetings since our last group. Patient shared that he felt empowered because he was "not scared to look up the effects of his disease anymore". Counselor took this to mean that he feels that he has stabilized in his recovery and feels confident that he has the tools and skills to maintain his sobriety. Patient shared that he feels that his life is "going slower" and that he has "More time" now that he is not drinking. Patient  challenged mindfulness lesson and said that he felt that counselor was instructing him to "do nothing" when he feels bad. Counselor reflected that mindfulness is living in the moment, not necessarily "living like a monk". Patient laughed and agreed. Counselor encouraged patient to "mindfully play" with his daughter by being fully present during play. He replied that he understood and was looking forward to trying that. Patient has made good progress in his recovery from alcohol and has the ability to stay sober if he chooses to utilize the tools and skills he has learned. Sobriety date remains 7/4. Youlanda Roys Counselor   Family Program: Family present? No   Name of family member(s):   UDS collected: No Results:  AA/NA attended?: YesWednesday and Thursday  Sponsor?: Yes   Florencia Zaccaro, LCAS

## 2015-12-04 ENCOUNTER — Other Ambulatory Visit (HOSPITAL_COMMUNITY): Payer: Self-pay

## 2015-12-05 ENCOUNTER — Ambulatory Visit (HOSPITAL_COMMUNITY): Payer: 59 | Admitting: Licensed Clinical Social Worker

## 2015-12-05 NOTE — Progress Notes (Signed)
Adair PatterWilliam C Ackley is a 35 y.o. male patient. CD-IOP: Excused Absence. The patient was excused from group today. He had requested he be excused to prepare for an important job interview he had today. He has done very well in the program and has had no other absences. He will be graduating from the program tomorrow. Excused today.         Lashika Erker, LCAS

## 2015-12-11 NOTE — Patient Instructions (Addendum)

## 2015-12-15 NOTE — Discharge Summary (Signed)
  [facility] Partial Hospitalization Program Chemical Dependency Discharge Summary   Christopher Reid [id number]  Date of Admission: 09/14/15 Date of Discharge: 08/31/15  Course of Treatment: Patient entered tx after completing a medical detox at [facility] for his alcoholism. Patient was motivated to stop using chemicals but felt overwhelmed by his need for a new job and fixing his relationship with his 37yo daughter. Patient had all negative drug tests, reported no relapses, and consistently attended at least 7 AA meetings per week. He secured a sponsor. Patient made good progress in treatment and worked on his communication, organization, and self-esteem during tx.  Goals and Activities to Help Maintain Sobriety: 1. Stay away from old friends who continue to drink and use mind-altering chemicals. 2. Continue practicing Fair Fighting rules in interpersonal conflicts. 3. Continue alcohol and drug refusal skills and call on support systems. 4. Utilize AA sponsor  Referrals: NA  Aftercare services:  1. Attend AA/NA meetings 4 times per week.   Discharge Plan: Aftercare services at [facility] Tuesday evenings 5:15-6:15pm with Chrisandra Carota, LCAS     Client has participated in the development of this discharge plan and has received a copy of this completed plan  Charmian Muff  12/15/2015   Charmian Muff, LCAS 12/15/2015

## 2016-05-05 ENCOUNTER — Emergency Department (HOSPITAL_COMMUNITY): Payer: 59

## 2016-05-05 ENCOUNTER — Encounter (HOSPITAL_COMMUNITY): Payer: Self-pay

## 2016-05-05 ENCOUNTER — Emergency Department (HOSPITAL_COMMUNITY)
Admission: EM | Admit: 2016-05-05 | Discharge: 2016-05-06 | Discharge status: Against Medical Advice | Payer: 59 | Attending: Emergency Medicine | Admitting: Emergency Medicine

## 2016-05-05 DIAGNOSIS — Z79899 Other long term (current) drug therapy: Secondary | ICD-10-CM | POA: Insufficient documentation

## 2016-05-05 DIAGNOSIS — F17200 Nicotine dependence, unspecified, uncomplicated: Secondary | ICD-10-CM | POA: Diagnosis not present

## 2016-05-05 DIAGNOSIS — F1010 Alcohol abuse, uncomplicated: Secondary | ICD-10-CM | POA: Insufficient documentation

## 2016-05-05 DIAGNOSIS — I10 Essential (primary) hypertension: Secondary | ICD-10-CM | POA: Diagnosis not present

## 2016-05-05 LAB — COMPREHENSIVE METABOLIC PANEL
ALBUMIN: 4.8 g/dL (ref 3.5–5.0)
ALK PHOS: 58 U/L (ref 38–126)
ALT: 69 U/L — AB (ref 17–63)
AST: 84 U/L — ABNORMAL HIGH (ref 15–41)
Anion gap: 18 — ABNORMAL HIGH (ref 5–15)
BUN: 12 mg/dL (ref 6–20)
CALCIUM: 8.7 mg/dL — AB (ref 8.9–10.3)
CO2: 21 mmol/L — AB (ref 22–32)
CREATININE: 0.7 mg/dL (ref 0.61–1.24)
Chloride: 98 mmol/L — ABNORMAL LOW (ref 101–111)
GFR calc non Af Amer: >60 mL/min (ref >60)
GLUCOSE: 109 mg/dL — AB (ref 65–99)
Potassium: 4 mmol/L (ref 3.5–5.1)
SODIUM: 137 mmol/L (ref 135–145)
Total Bilirubin: 0.8 mg/dL (ref 0.3–1.2)
Total Protein: 8.3 g/dL — ABNORMAL HIGH (ref 6.5–8.1)

## 2016-05-05 LAB — RAPID URINE DRUG SCREEN, HOSP PERFORMED
Amphetamines: NOT DETECTED
Barbiturates: NOT DETECTED
Benzodiazepines: NOT DETECTED
Cocaine: NOT DETECTED
Opiates: NOT DETECTED
Tetrahydrocannabinol: NOT DETECTED

## 2016-05-05 LAB — CBC
HCT: 53.4 % — ABNORMAL HIGH (ref 39.0–52.0)
Hemoglobin: 19.7 g/dL — ABNORMAL HIGH (ref 13.0–17.0)
MCH: 33.6 pg (ref 26.0–34.0)
MCHC: 36.9 g/dL — AB (ref 30.0–36.0)
MCV: 91.1 fL (ref 78.0–100.0)
PLATELETS: 176 10*3/uL (ref 150–400)
RBC: 5.86 MIL/uL — ABNORMAL HIGH (ref 4.22–5.81)
RDW: 13.2 % (ref 11.5–15.5)
WBC: 7.6 10*3/uL (ref 4.0–10.5)

## 2016-05-05 LAB — ETHANOL: Alcohol, Ethyl (B): 421 mg/dL (ref <5)

## 2016-05-05 MED ORDER — THIAMINE HCL 100 MG/ML IJ SOLN
100.0000 mg | Freq: Once | INTRAMUSCULAR | Status: AC
Start: 2016-05-05 — End: 2016-05-05
  Administered 2016-05-05: 100 mg via INTRAMUSCULAR
  Filled 2016-05-05: qty 2

## 2016-05-05 MED ORDER — LOPERAMIDE HCL 2 MG PO CAPS: 2.0000 mg | ORAL_CAPSULE | ORAL | Status: DC | PRN

## 2016-05-05 MED ORDER — NICOTINE 21 MG/24HR TD PT24
21.0000 mg | MEDICATED_PATCH | Freq: Once | TRANSDERMAL | Status: DC
  Administered 2016-05-05: 21 mg via TRANSDERMAL
  Filled 2016-05-05: qty 1

## 2016-05-05 MED ORDER — ONDANSETRON 4 MG PO TBDP: 4.0000 mg | ORAL_TABLET | Freq: Four times a day (QID) | ORAL | Status: DC | PRN

## 2016-05-05 MED ORDER — HYDROXYZINE HCL 25 MG PO TABS: 25.0000 mg | ORAL_TABLET | Freq: Four times a day (QID) | ORAL | Status: DC | PRN

## 2016-05-05 MED ORDER — ADULT MULTIVITAMIN W/MINERALS CH
1.0000 | ORAL_TABLET | Freq: Every day | ORAL | Status: DC
  Administered 2016-05-05: 1 via ORAL
  Filled 2016-05-05: qty 1

## 2016-05-05 MED ORDER — SODIUM CHLORIDE 0.9 % IV BOLUS (SEPSIS)
1000.0000 mL | Freq: Once | INTRAVENOUS | Status: AC
  Administered 2016-05-05: 1000 mL via INTRAVENOUS

## 2016-05-05 MED ORDER — CHLORDIAZEPOXIDE HCL 25 MG PO CAPS
25.0000 mg | ORAL_CAPSULE | Freq: Four times a day (QID) | ORAL | Status: DC | PRN
  Administered 2016-05-05: 25 mg via ORAL
  Filled 2016-05-05: qty 1

## 2016-05-05 MED ORDER — VITAMIN B-1 100 MG PO TABS
100.0000 mg | ORAL_TABLET | Freq: Every day | ORAL | Status: DC
  Filled 2016-05-05: qty 1

## 2016-05-05 NOTE — ED Notes (Signed)
Pt has urinal at bedside. Reminded by this RN of need for urine sample.

## 2016-05-05 NOTE — ED Provider Notes (Signed)
[facility]-EMERGENCY DEPT Provider Note   CSN: [id number] Arrival date & time: 05/05/16  1835     History   Chief Complaint Chief Complaint  Patient presents with  . Alcohol Problem  . Hand Pain    RIGHT    HPI Christopher PatterWilliam C Forrey is a 35 y.o. male.  The history is provided by the patient and a friend. No language interpreter was used.  Alcohol Problem   Hand Pain    Christopher Reid is a 35 y.o. male who presents to the Emergency Department complaining of alcohol problem.  He comes in seeking alcohol detox. He has a history of alcohol abuse since the age of 35. He states that he drinks large quantities of vodka. He was in alcohol treatment 9 months ago and was clean for 90 days. He was brought in at the encouragement of friends due to concern that he may drink himself to death. He denies any SI or HI. He did punch a cabinet last night and reports pain to the right dorsal hand. He is right-handed. He doesn't want to go through detox at home again. Past Medical History:  Diagnosis Date  . Alcohol abuse     Patient Active Problem List   Diagnosis Date Noted  . Acute alcoholic hepatitis 09/18/2015  . Substance induced mood disorder (HCC) 09/18/2015  . Chronic post-traumatic stress disorder (PTSD) 09/18/2015  . Alcohol use disorder, severe, dependence (HCC) 09/17/2015  . Alcohol-induced mood disorder (HCC) 09/09/2015  . Alcohol dependence with uncomplicated withdrawal (HCC) 09/09/2015  . Alcohol withdrawal (HCC) 09/09/2015  . Tobacco abuse 09/09/2015  . Depression 09/09/2015  . Elevated LFTs 09/09/2015  . Hypertension 09/09/2015  . Panic attack 03/16/2015  . Alcohol abuse 03/16/2015    History reviewed. No pertinent surgical history.     Home Medications    Prior to Admission medications   Medication Sig Start Date End Date Taking? Authorizing Provider  chlordiazePOXIDE (LIBRIUM) 25 MG capsule 50mg  PO TID x 1D, then 25-50mg  PO BID X 1D, then 25-50mg  PO QD X 1D  05/06/16   Tilden FossaElizabeth Nesreen Albano, MD  nicotine (NICODERM CQ - DOSED IN MG/24 HOURS) 21 mg/24hr patch Place 1 patch (21 mg total) onto the skin daily. Patient not taking: Reported on 05/05/2016 09/12/15   Zannie CovePreetha Joseph, MD  thiamine 100 MG tablet Take 1 tablet (100 mg total) by mouth daily. Patient not taking: Reported on 05/05/2016 09/12/15   Zannie CovePreetha Joseph, MD    Family History Family History  Problem Relation Age of Onset  . Alcohol abuse Paternal Uncle   . Alcohol abuse Maternal Grandfather   . Depression Mother   . Alcohol abuse Maternal Uncle   . Alcohol abuse Paternal Grandfather     Social History Social History  Substance Use Topics  . Smoking status: Current Every Day Smoker    Packs/day: 0.50  . Smokeless tobacco: Never Used  . Alcohol use 48.0 oz/week    80 Shots of liquor per week     Comment: heavy     Allergies   Patient has no known allergies.   Review of Systems Review of Systems  All other systems reviewed and are negative.    Physical Exam Updated Vital Signs BP 126/95   Pulse 106   Temp 98.6 F (37 C) (Oral)   Resp 21   Ht 6\' 4"  (1.93 m)   Wt 230 lb (104.3 kg)   SpO2 94%   BMI 28.00 kg/m   Physical Exam  Constitutional:  He is oriented to person, place, and time. He appears well-developed and well-nourished.  HENT:  Head: Normocephalic and atraumatic.  Cardiovascular: Regular rhythm.   No murmur heard. Tachycardic  Pulmonary/Chest: Effort normal and breath sounds normal. No respiratory distress.  Abdominal: Soft. There is no tenderness. There is no rebound and no guarding.  Musculoskeletal:  2+ radial pulses bilaterally. There is ecchymosis, swelling and tenderness over the right dorsal ulnar surface of the hand with tenderness over the fourth and fifth metacarpal phalangeal joints. Flexion/extension intact in the digits.   Neurological: He is alert and oriented to person, place, and time.  Skin: Skin is warm and dry.  Psychiatric:  Anxious  and mildly agitated  Nursing note and vitals reviewed.    ED Treatments / Results  Labs (all labs ordered are listed, but only abnormal results are displayed) Labs Reviewed  COMPREHENSIVE METABOLIC PANEL - Abnormal; Notable for the following:       Result Value   Chloride 98 (*)    CO2 21 (*)    Glucose, Bld 109 (*)    Calcium 8.7 (*)    Total Protein 8.3 (*)    AST 84 (*)    ALT 69 (*)    Anion gap 18 (*)    All other components within normal limits  ETHANOL - Abnormal; Notable for the following:    Alcohol, Ethyl (B) 421 (*)    All other components within normal limits  CBC - Abnormal; Notable for the following:    RBC 5.86 (*)    Hemoglobin 19.7 (*)    HCT 53.4 (*)    MCHC 36.9 (*)    All other components within normal limits  RAPID URINE DRUG SCREEN, HOSP PERFORMED    EKG  EKG Interpretation  Date/Time:  Sunday May 05 2016 18:56:11 EST Ventricular Rate:  123 PR Interval:    QRS Duration: 79 QT Interval:  343 QTC Calculation: 491 R Axis:   57 Text Interpretation:  Sinus tachycardia Anterior infarct, old Borderline T abnormalities, inferior leads Confirmed by Lincoln Brighamees, Liz [phone]([id number]) on 05/05/2016 7:18:16 PM       Radiology Dg Hand Complete Right  Result Date: 05/05/2016 CLINICAL DATA:  Punched a cabinet yesterday. Fourth and fifth metacarpal region pain with bruising. Initial encounter. EXAM: RIGHT HAND - COMPLETE 3+ VIEW COMPARISON:  None. FINDINGS: There is no evidence of acute fracture or dislocation. A 2 mm focus of calcification is noted adjacent to the distal phalanx of the small finger without evidence of underlying fracture. A 3 mm sclerotic focus in the lunate may represent a bone island. Joint space widths are preserved. No focal soft tissue abnormality is seen. IMPRESSION: No acute osseous abnormality identified. Electronically Signed   By: Sebastian AcheAllen  Grady M.D.   On: 05/05/2016 19:42    Procedures Procedures (including critical care time)  Medications  Ordered in ED Medications  thiamine (VITAMIN B-1) tablet 100 mg (not administered)  multivitamin with minerals tablet 1 tablet (1 tablet Oral Given 05/05/16 2113)  chlordiazePOXIDE (LIBRIUM) capsule 25 mg (25 mg Oral Given 05/05/16 2326)  hydrOXYzine (ATARAX/VISTARIL) tablet 25 mg (not administered)  loperamide (IMODIUM) capsule 2-4 mg (not administered)  ondansetron (ZOFRAN-ODT) disintegrating tablet 4 mg (not administered)  nicotine (NICODERM CQ - dosed in mg/24 hours) patch 21 mg (21 mg Transdermal Patch Applied 05/05/16 2252)  sodium chloride 0.9 % bolus 1,000 mL (0 mLs Intravenous Stopped 05/05/16 2235)  thiamine (B-1) injection 100 mg (100 mg Intramuscular Given 05/05/16 2115)  LORazepam (ATIVAN) injection 1 mg (1 mg Intravenous Given 05/06/16 0034)  sodium chloride 0.9 % bolus 1,000 mL (1,000 mLs Intravenous New Bag/Given 05/06/16 0034)     Initial Impression / Assessment and Plan / ED Course  I have reviewed the triage vital signs and the nursing notes.  Pertinent labs & imaging results that were available during my care of the patient were reviewed by me and considered in my medical decision making (see chart for details).  Clinical Course   Patient with history of alcohol abuse here seeking detox. Patient denies any SI or HI. He is intoxicated on initial examination with tachycardia and mild tremulousness. On repeat assessment he feels significantly improved and wishes to go home for outpatient detox. He declines inpatient treatment and he is not suicidal or homicidal. Discussed with patient concern for worsening symptoms given his prior admissions for EtOH abuse and withdrawal. Patient declines treatment at this time. Will provide with Librium taper with outpatient follow-up and return precautions.  Final Clinical Impressions(s) / ED Diagnoses   Final diagnoses:  Alcohol abuse    New Prescriptions New Prescriptions   CHLORDIAZEPOXIDE (LIBRIUM) 25 MG CAPSULE    50mg  PO TID x  1D, then 25-50mg  PO BID X 1D, then 25-50mg  PO QD X 1D     Tilden Fossa, MD 05/06/16 [phone]

## 2016-05-05 NOTE — ED Triage Notes (Signed)
PT STS HE IS AN ALCOHOLIC AND HAS BEEN DRINKING VODKA ALL DY. HE STS HIS LAST DRINK WAS 45 MINUTES AGO. HE ALSO STS HE PUNCHED A CABINET LAST NIGHT AND INJURED HIS RIGHT HAND. PT STS HE DOES NOT WANT TO DETOX, BUT HIS FAMILY STS THAT IS WHY THEY BROUGHT HIM. PT VERY UNSTEADY IN TRIAGE. DENIES SI/HI.

## 2016-05-06 MED ORDER — SODIUM CHLORIDE 0.9 % IV BOLUS (SEPSIS)
1000.0000 mL | Freq: Once | INTRAVENOUS | Status: AC
  Administered 2016-05-06: 1000 mL via INTRAVENOUS

## 2016-05-06 MED ORDER — CHLORDIAZEPOXIDE HCL 25 MG PO CAPS: ORAL_CAPSULE | ORAL | 0 refills | Status: DC

## 2016-05-06 MED ORDER — LORAZEPAM 2 MG/ML IJ SOLN
1.0000 mg | Freq: Once | INTRAMUSCULAR | Status: AC
  Administered 2016-05-06: 1 mg via INTRAVENOUS
  Filled 2016-05-06: qty 1

## 2016-06-30 ENCOUNTER — Encounter (HOSPITAL_COMMUNITY): Payer: Self-pay | Admitting: *Deleted

## 2016-06-30 ENCOUNTER — Emergency Department (HOSPITAL_COMMUNITY)
Admission: EM | Admit: 2016-06-30 | Discharge: 2016-07-01 | Disposition: A | Discharge status: Other Acute Inpatient Hospital | Payer: 59 | Attending: Emergency Medicine | Admitting: Emergency Medicine

## 2016-06-30 DIAGNOSIS — F332 Major depressive disorder, recurrent severe without psychotic features: Secondary | ICD-10-CM | POA: Insufficient documentation

## 2016-06-30 DIAGNOSIS — F17200 Nicotine dependence, unspecified, uncomplicated: Secondary | ICD-10-CM | POA: Insufficient documentation

## 2016-06-30 DIAGNOSIS — F10230 Alcohol dependence with withdrawal, uncomplicated: Secondary | ICD-10-CM

## 2016-06-30 DIAGNOSIS — F1094 Alcohol use, unspecified with alcohol-induced mood disorder: Secondary | ICD-10-CM

## 2016-06-30 DIAGNOSIS — I10 Essential (primary) hypertension: Secondary | ICD-10-CM | POA: Insufficient documentation

## 2016-06-30 DIAGNOSIS — Z79899 Other long term (current) drug therapy: Secondary | ICD-10-CM | POA: Insufficient documentation

## 2016-06-30 DIAGNOSIS — F10920 Alcohol use, unspecified with intoxication, uncomplicated: Secondary | ICD-10-CM

## 2016-06-30 DIAGNOSIS — F10288 Alcohol dependence with other alcohol-induced disorder: Secondary | ICD-10-CM | POA: Insufficient documentation

## 2016-06-30 LAB — COMPREHENSIVE METABOLIC PANEL WITH GFR
ALT: 130 U/L — ABNORMAL HIGH (ref 17–63)
AST: 163 U/L — ABNORMAL HIGH (ref 15–41)
Albumin: 4.2 g/dL (ref 3.5–5.0)
Alkaline Phosphatase: 73 U/L (ref 38–126)
Anion gap: 18 — ABNORMAL HIGH (ref 5–15)
BUN: 12 mg/dL (ref 6–20)
CO2: 19 mmol/L — ABNORMAL LOW (ref 22–32)
Calcium: 8.4 mg/dL — ABNORMAL LOW (ref 8.9–10.3)
Chloride: 102 mmol/L (ref 101–111)
Creatinine, Ser: 0.78 mg/dL (ref 0.61–1.24)
GFR calc Af Amer: >60 mL/min
GFR calc non Af Amer: >60 mL/min
Glucose, Bld: 96 mg/dL (ref 65–99)
Potassium: 4.1 mmol/L (ref 3.5–5.1)
Sodium: 139 mmol/L (ref 135–145)
Total Bilirubin: 0.6 mg/dL (ref 0.3–1.2)
Total Protein: 8.1 g/dL (ref 6.5–8.1)

## 2016-06-30 LAB — RAPID URINE DRUG SCREEN, HOSP PERFORMED
Amphetamines: NOT DETECTED
Barbiturates: NOT DETECTED
Benzodiazepines: NOT DETECTED
Cocaine: NOT DETECTED
Opiates: NOT DETECTED
Tetrahydrocannabinol: NOT DETECTED

## 2016-06-30 LAB — CBC WITH DIFFERENTIAL/PLATELET
Basophils Absolute: 0 10*3/uL (ref 0.0–0.1)
Basophils Relative: 0 %
Eosinophils Absolute: 0 10*3/uL (ref 0.0–0.7)
Eosinophils Relative: 0 %
HCT: 49.2 % (ref 39.0–52.0)
Hemoglobin: 17.7 g/dL — ABNORMAL HIGH (ref 13.0–17.0)
Lymphocytes Relative: 26 %
Lymphs Abs: 2 10*3/uL (ref 0.7–4.0)
MCH: 33.2 pg (ref 26.0–34.0)
MCHC: 36 g/dL (ref 30.0–36.0)
MCV: 92.3 fL (ref 78.0–100.0)
Monocytes Absolute: 0.5 10*3/uL (ref 0.1–1.0)
Monocytes Relative: 6 %
Neutro Abs: 5.3 10*3/uL (ref 1.7–7.7)
Neutrophils Relative %: 68 %
Platelets: 95 10*3/uL — ABNORMAL LOW (ref 150–400)
RBC: 5.33 MIL/uL (ref 4.22–5.81)
RDW: 13.3 % (ref 11.5–15.5)
WBC: 7.8 10*3/uL (ref 4.0–10.5)

## 2016-06-30 LAB — ETHANOL: Alcohol, Ethyl (B): 423 mg/dL

## 2016-06-30 MED ORDER — IBUPROFEN 200 MG PO TABS
600.0000 mg | ORAL_TABLET | Freq: Once | ORAL | Status: AC
  Administered 2016-06-30: 600 mg via ORAL
  Filled 2016-06-30: qty 3

## 2016-06-30 MED ORDER — SODIUM CHLORIDE 0.9 % IV BOLUS (SEPSIS)
1000.0000 mL | Freq: Once | INTRAVENOUS | Status: AC
  Administered 2016-06-30: 1000 mL via INTRAVENOUS

## 2016-06-30 MED ORDER — NICOTINE 21 MG/24HR TD PT24
21.0000 mg | MEDICATED_PATCH | Freq: Once | TRANSDERMAL | Status: DC
  Administered 2016-06-30: 21 mg via TRANSDERMAL
  Filled 2016-06-30: qty 1

## 2016-06-30 MED ORDER — VITAMIN B-1 100 MG PO TABS
100.0000 mg | ORAL_TABLET | Freq: Once | ORAL | Status: AC
  Administered 2016-06-30: 100 mg via ORAL
  Filled 2016-06-30: qty 1

## 2016-06-30 MED ORDER — LORAZEPAM 1 MG PO TABS
0.0000 mg | ORAL_TABLET | Freq: Four times a day (QID) | ORAL | Status: DC
  Administered 2016-07-01 (×2): 2 mg via ORAL
  Administered 2016-07-01 (×2): 1 mg via ORAL
  Filled 2016-06-30: qty 1
  Filled 2016-06-30: qty 2
  Filled 2016-06-30 (×2): qty 1
  Filled 2016-06-30: qty 2

## 2016-06-30 MED ORDER — LORAZEPAM 1 MG PO TABS: 0.0000 mg | ORAL_TABLET | Freq: Two times a day (BID) | ORAL | Status: DC

## 2016-06-30 MED ORDER — ONDANSETRON 4 MG PO TBDP
4.0000 mg | ORAL_TABLET | Freq: Once | ORAL | Status: AC
  Administered 2016-06-30: 4 mg via ORAL
  Filled 2016-06-30: qty 1

## 2016-06-30 MED ORDER — LORAZEPAM 1 MG PO TABS
1.0000 mg | ORAL_TABLET | Freq: Once | ORAL | Status: AC
  Administered 2016-06-30: 1 mg via ORAL
  Filled 2016-06-30: qty 1

## 2016-06-30 MED ORDER — ADULT MULTIVITAMIN W/MINERALS CH
1.0000 | ORAL_TABLET | Freq: Once | ORAL | Status: AC
  Administered 2016-06-30: 1 via ORAL
  Filled 2016-06-30: qty 1

## 2016-06-30 NOTE — ED Notes (Signed)
Pt's stepmother, Pati GalloBarbara Haas, [phone], in dept stating "[place] drove from [place] and I cannot take him back with me like this.  This is the 3rd time he has been this drunk and someone has had to come down.  He needs to be admitted.  Last time he was admitted up on the floor."

## 2016-06-30 NOTE — BH Assessment (Addendum)
TTS consult requested at 21:12. Pt's BAL 423 (HH) at 21:03. Unable to assess pt until the BAL is below 150. Berenda MoraleElaine H Reagan, RN notified.   Princess BruinsAquicha Duff, MSW, Theresia MajorsLCSWA

## 2016-06-30 NOTE — ED Notes (Signed)
ED Provider at bedside. 

## 2016-06-30 NOTE — ED Provider Notes (Signed)
[facility]-EMERG[facility] DEPT Provider Note   CSN: [id number] Arrival date & time: 06/30/16  1758  By signing my name below, I, Rosario AdieWilliam Andrew Hiatt, attest that this documentation has been prepared under the direction and in the presence of Raeford RazorStephen Jacee Enerson, MD. Electronically Signed: Rosario AdieWilliam Andrew Hiatt, ED Scribe. 06/30/16. 6:26 PM.  History   Chief Complaint Chief Complaint  Patient presents with  . Alcohol Intoxication   The history is provided by the patient, medical records and a relative. No language interpreter was used.   HPI Comments: Christopher Reid is a 36 y.o. male BIB EMS, with a h/o EtOH abuse and withdrawl, who presents to the Emergency Department with alcohol intoxication. Last use of alcohol was approximately three hours ago. Per EMS, pt was found by his stepmother passed out after consuming a significant amount of alcohol over the past several days. Pt notes that he has been drinking alcohol since the age of 89 and has some history of abuse; however, he states that he has recently been consuming more d/t increased stressors in his life including recently leaving his job and the people in his life. Pt attempted to quit drinking six months ago with some success and improvement, but prior chart review reveals that he was last seen in the ED for this issue on 05/05/16. He has previously undergone withdrawal with symptoms of nausea, vomiting, and tremors but without reported seizure. He denies SI/HI, or any other associated symptoms.   Past Medical History:  Diagnosis Date  . Alcohol abuse    Patient Active Problem List   Diagnosis Date Noted  . Acute alcoholic hepatitis 09/18/2015  . Substance induced mood disorder (HCC) 09/18/2015  . Chronic post-traumatic stress disorder (PTSD) 09/18/2015  . Alcohol use disorder, severe, dependence (HCC) 09/17/2015  . Alcohol-induced mood disorder (HCC) 09/09/2015  . Alcohol dependence with uncomplicated withdrawal (HCC) 09/09/2015  .  Alcohol withdrawal (HCC) 09/09/2015  . Tobacco abuse 09/09/2015  . Depression 09/09/2015  . Elevated LFTs 09/09/2015  . Hypertension 09/09/2015  . Panic attack 03/16/2015  . Alcohol abuse 03/16/2015   History reviewed. No pertinent surgical history.  Home Medications    Prior to Admission medications   Medication Sig Start Date End Date Taking? Authorizing Provider  chlordiazePOXIDE (LIBRIUM) 25 MG capsule 50mg  PO TID x 1D, then 25-50mg  PO BID X 1D, then 25-50mg  PO QD X 1D 05/06/16   Tilden FossaElizabeth Rees, MD  nicotine (NICODERM CQ - DOSED IN MG/24 HOURS) 21 mg/24hr patch Place 1 patch (21 mg total) onto the skin daily. Patient not taking: Reported on 05/05/2016 09/12/15   Zannie CovePreetha Joseph, MD  thiamine 100 MG tablet Take 1 tablet (100 mg total) by mouth daily. Patient not taking: Reported on 05/05/2016 09/12/15   Zannie CovePreetha Joseph, MD   Family History Family History  Problem Relation Age of Onset  . Alcohol abuse Paternal Uncle   . Alcohol abuse Maternal Grandfather   . Depression Mother   . Alcohol abuse Maternal Uncle   . Alcohol abuse Paternal Grandfather    Social History Social History  Substance Use Topics  . Smoking status: Current Every Day Smoker    Packs/day: 0.50  . Smokeless tobacco: Never Used  . Alcohol use 48.0 oz/week    80 Shots of liquor per week     Comment: heavy   Allergies   Patient has no known allergies.  Review of Systems Review of Systems  Constitutional: Negative for fever.       Positive for alcohol  intoxication.   Psychiatric/Behavioral: Negative for suicidal ideas.  All other systems reviewed and are negative.  Physical Exam Updated Vital Signs BP (!) 146/102   Pulse (!) 126   Temp 97.6 F (36.4 C) (Oral)   Resp 20   Ht 6\' 4"  (1.93 m)   Wt 235 lb (106.6 kg)   SpO2 96%   BMI 28.61 kg/m   Physical Exam  Constitutional: He appears well-developed and well-nourished.  HENT:  Head: Normocephalic.  Right Ear: External ear normal.  Left Ear:  External ear normal.  Nose: Nose normal.  Eyes: Conjunctivae are normal. Right eye exhibits no discharge. Left eye exhibits no discharge.  Neck: Normal range of motion.  Cardiovascular: Regular rhythm and normal heart sounds.  Tachycardia present.   No murmur heard. Pulmonary/Chest: Effort normal and breath sounds normal. No respiratory distress. He has no wheezes. He has no rales.  Abdominal: Soft. There is no tenderness. There is no rebound and no guarding.  Musculoskeletal: Normal range of motion. He exhibits no edema or tenderness.  Neurological: He is alert. No cranial nerve deficit. Coordination normal.  Skin: Skin is warm and dry. No rash noted. No erythema. No pallor.  Psychiatric: His speech is slurred. He is agitated. He expresses no homicidal and no suicidal ideation. He expresses no suicidal plans and no homicidal plans.  Mildly agitated but redirectable. Speech is slightly slurred but understandable. Poor insight.   Nursing note and vitals reviewed.  ED Treatments / Results  DIAGNOSTIC STUDIES: Oxygen Saturation is 96% on RA, normal by my interpretation.   COORDINATION OF CARE: 6:26 PM-Discussed next steps with pt. Pt verbalized understanding and is agreeable with the plan.   Labs (all labs ordered are listed, but only abnormal results are displayed) Labs Reviewed  CBC WITH DIFFERENTIAL/PLATELET - Abnormal; Notable for the following:       Result Value   Hemoglobin 17.7 (*)    Platelets 95 (*)    All other components within normal limits  COMPREHENSIVE METABOLIC PANEL - Abnormal; Notable for the following:    CO2 19 (*)    Calcium 8.4 (*)    AST 163 (*)    ALT 130 (*)    Anion gap 18 (*)    All other components within normal limits  ETHANOL - Abnormal; Notable for the following:    Alcohol, Ethyl (B) 423 (*)    All other components within normal limits  RAPID URINE DRUG SCREEN, HOSP PERFORMED    EKG  EKG Interpretation None      Radiology No results  found.  Procedures Procedures   Medications Ordered in ED Medications - No data to display  Initial Impression / Assessment and Plan / ED Course  I have reviewed the triage vital signs and the nursing notes.  Pertinent labs & imaging results that were available during my care of the patient were reviewed by me and considered in my medical decision making (see chart for details).     35yM with etoh abuse. Acutely intoxicated. Brought in by family. Stepmother drove from [place] with expectation of getting him to rehab. He tells me he just wants to go home and drink. She isn't willing to take him home at this point. He is too intoxicated to safely discharge. Will observe in the ED until he is clinically sober enough to discharge which will likely be in the morning.   Final Clinical Impressions(s) / ED Diagnoses   Final diagnoses:  Alcoholic intoxication without complication (HCC)  New Prescriptions New Prescriptions   No medications on file   I personally preformed the services scribed in my presence. The recorded information has been reviewed is accurate. Raeford Razor, MD.     Raeford Razor, MD 07/12/16 [phone]

## 2016-06-30 NOTE — ED Notes (Signed)
Pt requesting nicotine patch.  Pt stated "I smoke at least 1 ppd"

## 2016-06-30 NOTE — ED Notes (Signed)
Bed: [facility]27 Expected date:  Expected time:  Means of arrival:  Comments: 36 yo ETOH from Triage

## 2016-06-30 NOTE — ED Triage Notes (Signed)
Pt arrives via ems with c/o etoh. Pt has been drinking for several days (hx of etoh abuse). Today, stepmother found him passed out today, called ems. Pt reporting wants help with his ETOH use. Last ETOH around 3pm.

## 2016-07-01 ENCOUNTER — Encounter (HOSPITAL_COMMUNITY): Payer: Self-pay | Admitting: *Deleted

## 2016-07-01 ENCOUNTER — Inpatient Hospital Stay (HOSPITAL_COMMUNITY)
Admission: AD | Admit: 2016-07-01 | Discharge: 2016-07-08 | DRG: 897 | Disposition: A | Discharge status: Home / Self Care | Payer: Federal, State, Local not specified - Other | Source: Intra-hospital | Attending: Psychiatry | Admitting: Psychiatry

## 2016-07-01 DIAGNOSIS — F332 Major depressive disorder, recurrent severe without psychotic features: Secondary | ICD-10-CM | POA: Diagnosis present

## 2016-07-01 DIAGNOSIS — F419 Anxiety disorder, unspecified: Secondary | ICD-10-CM | POA: Diagnosis present

## 2016-07-01 DIAGNOSIS — Z811 Family history of alcohol abuse and dependence: Secondary | ICD-10-CM

## 2016-07-01 DIAGNOSIS — F1099 Alcohol use, unspecified with unspecified alcohol-induced disorder: Secondary | ICD-10-CM | POA: Diagnosis not present

## 2016-07-01 DIAGNOSIS — Z638 Other specified problems related to primary support group: Secondary | ICD-10-CM | POA: Diagnosis not present

## 2016-07-01 DIAGNOSIS — F1020 Alcohol dependence, uncomplicated: Secondary | ICD-10-CM | POA: Diagnosis present

## 2016-07-01 DIAGNOSIS — F10239 Alcohol dependence with withdrawal, unspecified: Secondary | ICD-10-CM | POA: Diagnosis present

## 2016-07-01 DIAGNOSIS — Z79899 Other long term (current) drug therapy: Secondary | ICD-10-CM

## 2016-07-01 DIAGNOSIS — F322 Major depressive disorder, single episode, severe without psychotic features: Secondary | ICD-10-CM | POA: Diagnosis present

## 2016-07-01 DIAGNOSIS — F17210 Nicotine dependence, cigarettes, uncomplicated: Secondary | ICD-10-CM | POA: Diagnosis present

## 2016-07-01 DIAGNOSIS — Z23 Encounter for immunization: Secondary | ICD-10-CM

## 2016-07-01 DIAGNOSIS — F4312 Post-traumatic stress disorder, chronic: Secondary | ICD-10-CM | POA: Diagnosis present

## 2016-07-01 DIAGNOSIS — F10229 Alcohol dependence with intoxication, unspecified: Principal | ICD-10-CM | POA: Diagnosis present

## 2016-07-01 DIAGNOSIS — I10 Essential (primary) hypertension: Secondary | ICD-10-CM | POA: Diagnosis present

## 2016-07-01 DIAGNOSIS — Z818 Family history of other mental and behavioral disorders: Secondary | ICD-10-CM | POA: Diagnosis not present

## 2016-07-01 DIAGNOSIS — F1024 Alcohol dependence with alcohol-induced mood disorder: Secondary | ICD-10-CM | POA: Diagnosis not present

## 2016-07-01 DIAGNOSIS — Y908 Blood alcohol level of 240 mg/100 ml or more: Secondary | ICD-10-CM | POA: Diagnosis present

## 2016-07-01 DIAGNOSIS — G4700 Insomnia, unspecified: Secondary | ICD-10-CM | POA: Diagnosis present

## 2016-07-01 HISTORY — DX: Major depressive disorder, single episode, unspecified: F32.9

## 2016-07-01 HISTORY — DX: Anxiety disorder, unspecified: F41.9

## 2016-07-01 HISTORY — DX: Depression, unspecified: F32.A

## 2016-07-01 HISTORY — DX: Insomnia, unspecified: G47.00

## 2016-07-01 MED ORDER — LORAZEPAM 1 MG PO TABS: 1.0000 mg | ORAL_TABLET | Freq: Two times a day (BID) | ORAL | Status: DC

## 2016-07-01 MED ORDER — LORAZEPAM 1 MG PO TABS
1.0000 mg | ORAL_TABLET | Freq: Three times a day (TID) | ORAL | Status: DC
  Administered 2016-07-03: 1 mg via ORAL
  Filled 2016-07-01: qty 1

## 2016-07-01 MED ORDER — INFLUENZA VAC SPLIT QUAD 0.5 ML IM SUSY
0.5000 mL | PREFILLED_SYRINGE | INTRAMUSCULAR | Status: AC
  Administered 2016-07-03: 0.5 mL via INTRAMUSCULAR
  Filled 2016-07-01: qty 0.5

## 2016-07-01 MED ORDER — LORAZEPAM 1 MG PO TABS
1.0000 mg | ORAL_TABLET | Freq: Once | ORAL | Status: AC
  Administered 2016-07-01: 1 mg via ORAL
  Filled 2016-07-01: qty 1

## 2016-07-01 MED ORDER — ONDANSETRON 4 MG PO TBDP: 4.0000 mg | ORAL_TABLET | Freq: Four times a day (QID) | ORAL | Status: AC | PRN

## 2016-07-01 MED ORDER — ALUM & MAG HYDROXIDE-SIMETH 200-200-20 MG/5ML PO SUSP: 30.0000 mL | ORAL | Status: DC | PRN

## 2016-07-01 MED ORDER — ACETAMINOPHEN 325 MG PO TABS
650.0000 mg | ORAL_TABLET | Freq: Four times a day (QID) | ORAL | Status: DC | PRN
  Administered 2016-07-01 – 2016-07-02 (×3): 650 mg via ORAL
  Filled 2016-07-01 (×3): qty 2

## 2016-07-01 MED ORDER — ADULT MULTIVITAMIN W/MINERALS CH
1.0000 | ORAL_TABLET | Freq: Every day | ORAL | Status: DC
  Administered 2016-07-01 – 2016-07-08 (×8): 1 via ORAL
  Filled 2016-07-01 (×12): qty 1

## 2016-07-01 MED ORDER — VITAMIN B-1 100 MG PO TABS
100.0000 mg | ORAL_TABLET | Freq: Every day | ORAL | Status: DC
  Administered 2016-07-01 – 2016-07-08 (×8): 100 mg via ORAL
  Filled 2016-07-01 (×11): qty 1

## 2016-07-01 MED ORDER — GABAPENTIN 300 MG PO CAPS
300.0000 mg | ORAL_CAPSULE | Freq: Three times a day (TID) | ORAL | Status: DC
  Administered 2016-07-01: 300 mg via ORAL
  Filled 2016-07-01: qty 1

## 2016-07-01 MED ORDER — HYDROXYZINE HCL 25 MG PO TABS
25.0000 mg | ORAL_TABLET | Freq: Four times a day (QID) | ORAL | Status: AC | PRN
  Administered 2016-07-01 – 2016-07-03 (×2): 25 mg via ORAL
  Filled 2016-07-01 (×2): qty 1

## 2016-07-01 MED ORDER — MAGNESIUM HYDROXIDE 400 MG/5ML PO SUSP: 30.0000 mL | Freq: Every day | ORAL | Status: DC | PRN

## 2016-07-01 MED ORDER — LORAZEPAM 1 MG PO TABS
1.0000 mg | ORAL_TABLET | Freq: Four times a day (QID) | ORAL | Status: AC
  Administered 2016-07-01 – 2016-07-03 (×6): 1 mg via ORAL
  Filled 2016-07-01 (×7): qty 1

## 2016-07-01 MED ORDER — THIAMINE HCL 100 MG/ML IJ SOLN
100.0000 mg | Freq: Once | INTRAMUSCULAR | Status: AC
  Administered 2016-07-01: 100 mg via INTRAMUSCULAR

## 2016-07-01 MED ORDER — LOPERAMIDE HCL 2 MG PO CAPS
2.0000 mg | ORAL_CAPSULE | ORAL | Status: AC | PRN
  Administered 2016-07-03: 4 mg via ORAL
  Filled 2016-07-01: qty 2

## 2016-07-01 MED ORDER — LORAZEPAM 1 MG PO TABS
1.0000 mg | ORAL_TABLET | Freq: Four times a day (QID) | ORAL | Status: DC | PRN
  Administered 2016-07-01: 1 mg via ORAL

## 2016-07-01 MED ORDER — NICOTINE 21 MG/24HR TD PT24
21.0000 mg | MEDICATED_PATCH | Freq: Every day | TRANSDERMAL | Status: DC
  Administered 2016-07-01 – 2016-07-07 (×7): 21 mg via TRANSDERMAL
  Filled 2016-07-01 (×11): qty 1

## 2016-07-01 MED ORDER — LORAZEPAM 1 MG PO TABS: 1.0000 mg | ORAL_TABLET | Freq: Every day | ORAL | Status: DC

## 2016-07-01 NOTE — ED Notes (Signed)
[facility] transport called  

## 2016-07-01 NOTE — BH Assessment (Signed)
[facility] Assessment Progress Note  Per Thedore MinsMojeed Akintayo, MD, this pt requires psychiatric hospitalization at this time.  Malva LimesLinsey Strader, RN, [facility]; they will be ready to receive pt at 14:30.  Nanine MeansJamison Lord, DNP also notes that pt's BAL will need to be below 200.  Pt has signed Voluntary Admission and Consent for Treatment, as well as Consent to Release Information to his step-mother, as well as [facility], and a notification call has been placed to the latter.  Signed forms have [facility] fax[facility].  Pt's nurse, Aram BeechamCynthia, has been notified, and agrees to send original paperwork along with pt via [facility], and to call report to [phone] when the time comes.  Doylene Canninghomas Yitta Gongaware, MA Triage Specialist [phone]

## 2016-07-01 NOTE — Progress Notes (Signed)
Patient ID: Christopher Reid, male   DOB: Sep 10, 1980, 36 y.o.   MRN: [id number] Patient has a history of alcohol abuse.  Denies drug use.  Patient quit his job in December due to depression and anxiety.  Patient lives by himself but has a 38 year old daughter living in town with her mother.  Patient c/o wanting etoh detox, depression, anxiety and insomnia.  Patient has been going to outpatient therapy here "downstairs" since April and sees Thurston Holenne; sts he was sober for 7 months and then started drinking again due to being lonely and depressed.  Patients last drink of alcohol was 24 hours ago and hes exhibiting withdraw symptoms.

## 2016-07-01 NOTE — BH Assessment (Addendum)
Assessment Note  Christopher Reid is an 36 y.o. male that presents this date for passive thoughts of self harm without a plan. Patient is partially impaired this date during assessment and had a BAL of 423 on admission. Patient's stepmother is at bedside Christopher Reid [phone] (patient gives consent to this writer to gather collateral from stepmother). Patient finds it difficult to interact with this Clinical research associatewriter and is a poor historian. Patient is oriented to place but is unsure what hospital he is in. Patient has visible tremors and reports increased agitation and nausea. Patient renders limited information to this Clinical research associatewriter although stepmother reports patient had contacted her earlier this date stating "I am killing myself with all this alcohol." Patient's mother reports she is from [place] and drove 10 hours when she received the call from her son. Patient's mother reports she found him severely impaired at his residence and contacted EMS [facility]. Patient denies any active S/I, H/I or AVH. Patient does report passive S/I stating he "doesn't want to go on with life." Patent has [facility] for similar symptoms (substance induced mood D/O) but did not follow up with aftercare. Patient denies any MH diagnoses but does report ongoing depression with symptoms to include: guilt, fatigue and hopelessness. Patient is requesting a voluntary admission to assist with depression and withdrawals. Admission notes state: "Patient is a 36 y.o. male BIB EMS, with a h/o ETOH abuse and withdrawl, who presents to the Emergency Department with alcohol intoxication. Last use of alcohol was approximately three hours ago. Per EMS, pt was found by his stepmother passed out after consuming a significant amount of alcohol over the past several days. Pt notes that he has been drinking alcohol since the age of 36 and has some history of abuse; however, he states that he has recently been consuming more d/t  increased stressors in his life including recently leaving his job and the people in his life. Pt attempted to quit drinking six months ago with some success and improvement, but prior chart review reveals that he was last seen in the ED for this issue on 05/05/16. He has previously undergone withdrawal with symptoms of nausea, vomiting, and tremors but without reported seizure. He denies SI/HI, or any other associated symptoms". Case was staffed with Akintayo MD who recommend a inpatient admission as appropriate bed placement is investigated.   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 Alcohol abuse     History reviewed. No pertinent surgical history.  Family History:  Family History  Problem Relation Age of Onset  . Alcohol abuse Paternal Uncle   . Alcohol abuse Maternal Grandfather   . Depression Mother   . Alcohol abuse Maternal Uncle   . Alcohol abuse Paternal Grandfather     Social History:  reports that he has been smoking.  He has been smoking about 0.50 packs per day. He has never used smokeless tobacco. He reports that he drinks about 48.0 oz of alcohol per week . He reports that he does not use drugs.  Additional Social History:  Alcohol / Drug Use Pain Medications: None Prescriptions: Thiamine Over the Counter: see chart History of alcohol / drug use?: Yes Longest period of sobriety (when/how long): 7 months Withdrawal Symptoms: Agitation, Tremors, Nausea / Vomiting, Weakness Substance #1 Name of Substance 1: ETOH 1 - Age of First Use: 19 1 -  Amount (size/oz): 1 pint a day 1 - Frequency: daily 1 - Duration: Since Oct 17  1 - Last Use / Amount: 06/30/16  2 pints  CIWA: CIWA-Ar BP: 139/97 Pulse Rate: 103 Nausea and Vomiting: no nausea and no vomiting Tactile Disturbances: mild itching, pins and needles, burning or numbness Tremor: three Auditory Disturbances:  not present Paroxysmal Sweats: three Visual Disturbances: not present Anxiety: no anxiety, at ease Headache, Fullness in Head: moderate Agitation: two (patient reports he cannot sleep) Orientation and Clouding of Sensorium: oriented and can do serial additions CIWA-Ar Total: 13 COWS:    Allergies: No Known Allergies  Home Medications:  (Not in a hospital admission)  OB/GYN Status:  No LMP for male patient.  General Assessment Data Location of Assessment: [facility] TTS Assessment: In system Is this a Tele or Face-to-Face Assessment?: Face-to-Face Is this an Initial Assessment or a Re-assessment for this encounter?: Initial Assessment Marital status: Single Maiden name: na Is patient pregnant?: No Pregnancy Status: No Living Arrangements: Alone Can pt return to current living arrangement?: Yes Admission Status: Voluntary Is patient capable of signing voluntary admission?: Yes Referral Source: Self/Family/Friend Insurance type: Self pay  Medical Screening Exam [facility] Walk-in ONLY) Medical Exam completed: Yes  Crisis Care Plan Living Arrangements: Alone Legal Guardian: Other: (na) Name of Psychiatrist: None Name of Therapist: None  Education Status Is patient currently in school?: No Current Grade:  (na) Highest grade of school patient has completed:  (12) Name of school:  (na) Contact person: na  Risk to self with the past 6 months Suicidal Ideation: Yes-Currently Present Has patient been a risk to self within the past 6 months prior to admission? : No Suicidal Intent: No Has patient had any suicidal intent within the past 6 months prior to admission? : No Is patient at risk for suicide?: No Suicidal Plan?: No Has patient had any suicidal plan within the past 6 months prior to admission? : No Access to Means: No What has been your use of drugs/alcohol within the last 12 months?: Current use Previous Attempts/Gestures: No How many times?: 0 Other Self Harm Risks:  na Triggers for Past Attempts: Unknown Intentional Self Injurious Behavior: None Family Suicide History: No Recent stressful life event(s): Job Loss Persecutory voices/beliefs?: No Depression: Yes Depression Symptoms: Guilt, Loss of interest in usual pleasures Substance abuse history and/or treatment for substance abuse?: Yes Suicide prevention information given to non-admitted patients: Not applicable  Risk to Others within the past 6 months Homicidal Ideation: No Does patient have any lifetime risk of violence toward others beyond the six months prior to admission? : No Thoughts of Harm to Others: No Current Homicidal Intent: No Current Homicidal Plan: No Access to Homicidal Means: No Identified Victim: na History of harm to others?: No Assessment of Violence: None Noted Violent Behavior Description: na Does patient have access to weapons?: No Criminal Charges Pending?: No Does patient have a court date: No Is patient on probation?: No  Psychosis Hallucinations: None noted Delusions: None noted  Mental Status Report Appearance/Hygiene: In scrubs Eye Contact: Fair Motor Activity: Freedom of movement Speech: Slurred Level of Consciousness: Drowsy Mood: Anxious Affect: Depressed Anxiety Level: Moderate Thought Processes: Coherent, Relevant Judgement: Partial Orientation: Place Obsessive Compulsive Thoughts/Behaviors: None  Cognitive Functioning Concentration: Decreased Memory: Recent Impaired IQ: Average Insight: Fair Impulse Control: Poor Appetite: Poor Weight Loss: 10 Weight Gain: 0 Sleep: Decreased Total Hours of Sleep: 5 Vegetative Symptoms: None  ADLScreening [facility] Assessment Services) Patient's cognitive ability adequate to  safely complete daily activities?: Yes Patient able to express need for assistance with ADLs?: Yes Independently performs ADLs?: Yes (appropriate for developmental age)  Prior Inpatient Therapy Prior Inpatient Therapy: Yes Prior  Therapy Dates: 2017 Prior Therapy Facilty/Provider(s): [facility] Reason for Treatment: ETOH issues, S/I  Prior Outpatient Therapy Prior Outpatient Therapy: No Prior Therapy Dates: na Prior Therapy Facilty/Provider(s): na Reason for Treatment: na Does patient have an ACCT team?: No Does patient have Intensive In-House Services?  : No Does patient have [facility] services? : No Does patient have P4CC services?: No  ADL Screening (condition at time of admission) Patient's cognitive ability adequate to safely complete daily activities?: Yes Is the patient deaf or have difficulty hearing?: No Does the patient have difficulty seeing, even when wearing glasses/contacts?: No Does the patient have difficulty concentrating, remembering, or making decisions?: No Patient able to express need for assistance with ADLs?: Yes Does the patient have difficulty dressing or bathing?: No Independently performs ADLs?: Yes (appropriate for developmental age) Does the patient have difficulty walking or climbing stairs?: No Weakness of Legs: None Weakness of Arms/Hands: None  Home Assistive Devices/Equipment Home Assistive Devices/Equipment: None  Therapy Consults (therapy consults require a physician order) PT Evaluation Needed: No OT Evalulation Needed: No SLP Evaluation Needed: No Abuse/Neglect Assessment (Assessment to be complete while patient is alone) Physical Abuse: Denies Verbal Abuse: Denies Sexual Abuse: Denies Exploitation of patient/patient's resources: Denies Self-Neglect: Denies Values / Beliefs Cultural Requests During Hospitalization: None Spiritual Requests During Hospitalization: None Consults Spiritual Care Consult Needed: No Social Work Consult Needed: No Merchant navy officer (For Healthcare) Does Patient Have a Medical Advance Directive?: No Would patient like information on creating a medical advance directive?: No - Patient declined    Additional Information 1:1 In Past 12  Months?: No CIRT Risk: No Elopement Risk: No Does patient have medical clearance?: Yes     Disposition: Case was staffed with Akintayo MD who recommend a inpatient admission as appropriate bed placement is investigated.    Disposition Initial Assessment Completed for this Encounter: Yes Disposition of Patient: Inpatient treatment program Type of inpatient treatment program: Adult  On Site Evaluation by:   Reviewed with Physician:    Alfredia Ferguson 07/01/2016 11:09 AM

## 2016-07-01 NOTE — ED Notes (Signed)
Report called to behavioral health, Charity fundraiserBeverly RN

## 2016-07-01 NOTE — Progress Notes (Addendum)
This nurse in search room to perform pt skin assessment, no abnormalities noted.

## 2016-07-01 NOTE — Progress Notes (Signed)
Adult Psychoeducational Group Note  Date:  07/01/2016 Time:  10:42 PM  Group Topic/Focus:  Wrap-Up Group:   The focus of this group is to help patients review their daily goal of treatment and discuss progress on daily workbooks.  Participation Level:  Did Not Attend  Participation Quality:  Did not attend  Affect:  Did not attend  Cognitive:  Did not attend  Insight: None  Engagement in Group:  Did not attend  Modes of Intervention:  Did not attend'  Additional Comments:  Patient did not attend wrap up group this evening.   Meryle Pugmire L Sariah Henkin 07/01/2016, 10:42 PM

## 2016-07-01 NOTE — ED Notes (Signed)
Pt stated "I was sober for 6 months, had friends and everything was so good.  I was going to like 4 AA meetings a day.  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  My daughter's mother, got what she wanted, she got that baby and then was gone.  Her dad has been living off me x 10 years."

## 2016-07-01 NOTE — BH Assessment (Signed)
[facility] Assessment Progress Note   Case was staffed with Akintayo MD who recommend a inpatient admission as appropriate bed placement is investigated

## 2016-07-01 NOTE — ED Notes (Signed)
Patient talking with counselor, Dave. 

## 2016-07-01 NOTE — ED Notes (Signed)
Patient reports he has a headache and is feeling shaky.

## 2016-07-02 DIAGNOSIS — Z811 Family history of alcohol abuse and dependence: Secondary | ICD-10-CM

## 2016-07-02 DIAGNOSIS — Z818 Family history of other mental and behavioral disorders: Secondary | ICD-10-CM

## 2016-07-02 DIAGNOSIS — F1024 Alcohol dependence with alcohol-induced mood disorder: Secondary | ICD-10-CM

## 2016-07-02 DIAGNOSIS — F332 Major depressive disorder, recurrent severe without psychotic features: Secondary | ICD-10-CM

## 2016-07-02 DIAGNOSIS — Z79899 Other long term (current) drug therapy: Secondary | ICD-10-CM

## 2016-07-02 NOTE — Progress Notes (Signed)
Nursing Progress Note 7p-7a  D) Patient presents anxious and depressed. Patient is minimal with Clinical research associatewriter and isolates to his room since admission. Patient provided snacks and fluids. Patient denies SI/HI or AVH. Patient states he has a mild headache. Patient complains of withdrawal symptoms. Patient contracts for safety at this time.   A) Emotional support given. Patient medicated with orders as prescribed. Medications reviewed with patient. Patient on q15 min safety checks. Opportunities for questions or concerns presented to patient. Patient encouraged to continue to work on treatment goals.  R) Patient receptive to interaction with nurse. Patient remains safe on the unit at this time. Patient is resting in bed without complaints. Will continue to monitor.

## 2016-07-02 NOTE — BHH Counselor (Signed)
Adult Comprehensive Assessment  Patient ID: Christopher Reid, male   DOB: [birth year], 36 y.o.   MRN: [id number]  Information Source: Information source: Patient  Current Stressors:  Employment / Job issues: Unemployed Family Relationships: Distant-parents live in [place].  Conflictual with mother of his daughter Surveyor, quantityinancial / Lack of resources (include bankruptcy): Running out of money Substance abuse: Alcohol daily  Living/Environment/Situation:  Living Arrangements: Alone Living conditions (as described by patient or guardian): good neighborhood How long has patient lived in current situation?: 6 years What is atmosphere in current home: Comfortable, Supportive  Family History:  Marital status: Separated Separated, when?: 2 years ago What types of issues is patient dealing with in the relationship?: "I have some held over resentment" Are you sexually active?: No What is your sexual orientation?: heterosexual Does patient have children?: Yes How many children?: 1 How is patient's relationship with their children?: He has a good relationship, but admitted he hasn't been a very good dad for the last few months, see her once a week  Childhood History:  By whom was/is the patient raised?: Both parents Additional childhood history information: parents did not get along and there were no displays of affection and emotional disclosures in his childhood home. Parents divorced when patient was 36 yo  Description of patient's relationship with caregiver when they were a child: good Patient's description of current relationship with people who raised him/her: Both live in [place] How were you disciplined when you got in trouble as a child/adolescent?: appropriately Does patient have siblings?: Yes Number of Siblings: 2 Did patient suffer any verbal/emotional/physical/sexual abuse as a child?: No Has patient ever been sexually abused/assaulted/raped as an adolescent or adult?:  No Witnessed domestic violence?: No Has patient been effected by domestic violence as an adult?: No  Education:  Highest grade of school patient has completed: Technical sales engineerundergraduate degreee Currently a student?: No Learning disability?: No  Employment/Work Situation:   Employment situation: Unemployed Patient's job has been impacted by current illness: No What is the longest time patient has a held a job?: 10 years Where was the patient employed at that time?: [employer]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 [employer] Has patient ever been in the [employer] and Companymilitary?: No Are There Guns or Other Weapons in Your Home?: No  Financial Resources:   Financial resources: No income Does patient have a Lawyerrepresentative payee or guardian?: No  Alcohol/Substance Abuse:   What has been your use of drugs/alcohol within the last 12 months?: Alcohol, pint to a fifth a gallon on vidka daily, plus 6 16 oz beers If attempted suicide, did drugs/alcohol play a role in this?: No Alcohol/Substance Abuse Treatment Hx: Past Tx, Outpatient If yes, describe treatment: [facility] Has alcohol/substance abuse ever caused legal problems?: Yes (DUI 2007)  Social Support System:   Patient's Community Support System: Poor Describe Community Support System: AA buddies-if I call Type of faith/religion: Chrisitan How does patient's faith help to cope with current illness?: Reading the big book and praying  Leisure/Recreation:   Leisure and Hobbies: Watching sports, doing things alone with daughter  Strengths/Needs:   What things does the patient do well?: Unable to name anything In what areas does patient struggle / problems for patient: "Resentments, my sobriety  Discharge Plan:   Does patient have access to transportation?: Yes Will patient be returning to same living situation after discharge?: No Plan for living situation after discharge: Hoping to get into rehab from here Currently receiving community mental health services: No If no, would patient  like referral for services when discharged?:  ([place]) Does patient have financial barriers related to discharge medications?: No  Summary/Recommendations:   Summary and Recommendations (to be completed by the evaluator): Christopher Reid is a 36 YO Caucasian male diagnosed with MDD, recurrent, severe with psychosis and Alcohol Use D/O.  He presents voluntarily for detox and help with symptoms of depression and anxiety.  Christopher Reid is hoping to get into rehab from here, and signed releases for Christopher Reid and [facility].  Jonh can benefit from crises stabilization, medication management, therapeutic milieu and referral for services.   Ida Rogue. 07/02/2016

## 2016-07-02 NOTE — [facility] Suicide Risk Assessment (Signed)
[facility]   Nursing information obtained from:  Patient Demographic factors:  Male, Caucasian, Low socioeconomic status, Living alone, Unemployed Current Mental Status:  NA Loss Factors:  Decrease in vocational status, Loss of significant relationship, Financial problems / change in socioeconomic status Historical Factors:  Impulsivity Risk Reduction Factors:  Responsible for children under 38 years of age, Sense of responsibility to family  Total Time spent with patient: 45 minutes Principal Problem:  Alcohol Dependence, Alcohol induced mood disorder, depressed Diagnosis:   Patient Active Problem List   Diagnosis Date Noted  . Major depressive disorder, recurrent severe without psychotic features (HCC) [F33.2] 07/01/2016  . Major depressive disorder, single episode, severe without psychosis (HCC) [F32.2] 07/01/2016  . Acute alcoholic hepatitis [K70.10] 09/18/2015  . Substance induced mood disorder (HCC) [F19.94] 09/18/2015  . Chronic post-traumatic stress disorder (PTSD) [F43.12] 09/18/2015  . Alcohol use disorder, severe, dependence (HCC) [F10.20] 09/17/2015  . Alcohol-induced mood disorder (HCC) [F10.94] 09/09/2015  . Alcohol dependence with uncomplicated withdrawal (HCC) [F10.230] 09/09/2015  . Alcohol withdrawal (HCC) [F10.239] 09/09/2015  . Tobacco abuse [Z72.0] 09/09/2015  . Depression [F32.9] 09/09/2015  . Elevated LFTs [R79.89] 09/09/2015  . Hypertension [I10] 09/09/2015  . Panic attack [F41.0] 03/16/2015  . Alcohol abuse [F10.10] 03/16/2015    Continued Clinical Symptoms:    The "Alcohol Use Disorders Identification Test", Guidelines for Use in Primary Care, Second Edition.  World Science writerHealth Organization [facility](WHO). Score between 0-7:  no or low risk or alcohol related problems. Score between 8-15:  moderate risk of alcohol related problems. Score between 16-19:  high risk of alcohol related problems. Score 20 or above:  warrants further diagnostic  evaluation for alcohol dependence and treatment.   CLINICAL FACTORS:  36 year old male, history of alcohol dependence, relapsed late last year after several months of sobriety, has been drinking heavily , daily, presented to ED requesting detoxification .   Psychiatric Specialty Exam: Physical Exam  ROS  Blood pressure 134/90, pulse (!) 104, temperature 97.5 F (36.4 C), temperature source Oral, resp. rate 18, height 6\' 4"  (1.93 m), weight 98.9 kg (218 lb), SpO2 98 %.Body mass index is 26.54 kg/m.   see admit note MSE   COGNITIVE FEATURES THAT CONTRIBUTE TO RISK:  Closed-mindedness and Loss of executive function    SUICIDE RISK:   Mild:  Suicidal ideation of limited frequency, intensity, duration, and specificity.  There are no identifiable plans, no associated intent, mild dysphoria and related symptoms, good self-control (both objective and subjective assessment), few other risk factors, and identifiable protective factors, including available and accessible social support.  PLAN OF CARE: Admit patient to inpatient unit for the purpose of detoxification/ management of withdrawal. Provide support and encouragement regarding sobriety/abstinence efforts. Will follow daily.   I certify that inpatient services furnished can reasonably be expected to improve the patient's condition.   Nehemiah MassedOBOS, FERNANDO, MD 07/02/2016, 5:07 PM

## 2016-07-02 NOTE — Tx Team (Signed)
Initial Treatment Plan 07/02/2016 1:57 AM Adair PatterWilliam C Curto BJY:[id number]    PATIENT STRESSORS: Substance abuse   PATIENT STRENGTHS: Average or above average intelligence Capable of independent living Communication skills  Supportive family/friends    PATIENT IDENTIFIED PROBLEMS: "my drinking problem"  "dealing with life"  Substance Abuse  Depression               DISCHARGE CRITERIA:  Ability to meet basic life and health needs Adequate post-discharge living arrangements Improved stabilization in mood, thinking, and/or behavior Medical problems require only outpatient monitoring Motivation to continue treatment in a less acute level of care Need for constant or close observation no longer present Reduction of life-threatening or endangering symptoms to within safe limits Safe-care adequate arrangements made Verbal commitment to aftercare and medication compliance Withdrawal symptoms are absent or subacute and managed without 24-hour nursing intervention  PRELIMINARY DISCHARGE PLAN: Outpatient therapy  PATIENT/FAMILY INVOLVEMENT: This treatment plan has been presented to and reviewed with the patient, Adair PatterWilliam C Carriero.  The patient and family have been given the opportunity to ask questions and make suggestions.  Ferrel LoganAmanda A Filippo Puls, RN 07/02/2016, 1:57 AM

## 2016-07-02 NOTE — Plan of Care (Signed)
Problem: Safety: Goal: Periods of time without injury will increase Outcome: Progressing Patient denies SI at this time and contracts verbally for safety. Patient is on q15 min safety checks and remains safe at this time.   

## 2016-07-02 NOTE — Progress Notes (Signed)
D: Patient denies SI/HI and A/V hallucinations; patient reports anxiety, sweating, and sleep disturbance  A: Monitored q 15 minutes; patient encouraged to attend groups; patient educated about medications; patient given medications per physician orders; patient encouraged to express feelings and/or concerns; patient given fluids and encouraged to drink fluids  R: Patient is cooperative and pleasant but anxious; patient forwards little information; patient anxiety appears to be inferred; patient reports relief of some of his withdrawal symptoms

## 2016-07-02 NOTE — H&P (Signed)
Psychiatric Admission Assessment Adult  Patient Identification: Christopher Reid MRN:  [id number] Date of Evaluation:  07/02/2016 Chief Complaint:  " I needed detox " Principal Diagnosis: alcohol dependence  Diagnosis:   Patient Active Problem List   Diagnosis Date Noted  . Major depressive disorder, recurrent severe without psychotic features ([place]) [F33.2] 07/01/2016  . Major depressive disorder, single episode, severe without psychosis ([place]) [F32.2] 07/01/2016  . Acute alcoholic hepatitis [P61.95] 09/18/2015  . Substance induced mood disorder ([place]) [F19.94] 09/18/2015  . Chronic post-traumatic stress disorder (PTSD) [F43.12] 09/18/2015  . Alcohol use disorder, severe, dependence ([place]) [F10.20] 09/17/2015  . Alcohol-induced mood disorder ([place]) [F10.94] 09/09/2015  . Alcohol dependence with uncomplicated withdrawal ([place]) [F10.230] 09/09/2015  . Alcohol withdrawal ([place]) [F10.239] 09/09/2015  . Tobacco abuse [Z72.0] 09/09/2015  . Depression [F32.9] 09/09/2015  . Elevated LFTs [R79.89] 09/09/2015  . Hypertension [I10] 09/09/2015  . Panic attack [F41.0] 03/16/2015  . Alcohol abuse [F10.10] 03/16/2015   History of Present Illness: 36 year old male . Reports he has history of alcohol dependence, and after a period of 7 months of sobriety relapsed last October. Has been drinking daily, drinks about 6 beers and 6 mini bottles of liquor per day.  Last alcohol consumption 48 hours ago.  States that he has had recent blackouts . Recently his stepmother came to visit him from [place] as family has been aware he is not doing well and found him severely intoxicated, so called 64. Admission BAL 423. Of note, admission UDS negative . Denies severe depression , but states he has been isolating , withdrawn, feeling " lonely".  Patient reports psychosocial stressors. Broke up several months ago. 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 Describes some neuro-vegetative symptoms but denies  any anhedonia or suicidal ideations.  Associated Signs/Symptoms: Depression Symptoms:  insomnia, loss of energy/fatigue, decreased appetite, (Hypo) Manic Symptoms:  Denies  Anxiety Symptoms:  Reports some increased anxiety, ruminations about psychosocial stressors Psychotic Symptoms:  Denies hallucinations, no delusions  PTSD Symptoms: Denies  Total Time spent with patient: 45 minutes  Past Psychiatric History: no prior inpatient admissions, no history of suicide attempts, denies history of self injurious ideations, denies history of violence, no history of psychosis, no history of mania, no history of PTSD, denies history of severe depressive symptoms. Endorses history of alcohol dependence as above .  Is the patient at risk to self? No.  Has the patient been a risk to self in the past 6 months? No.  Has the patient been a risk to self within the distant past? No.  Is the patient a risk to others? No.  Has the patient been a risk to others in the past 6 months? No.  Has the patient been a risk to others within the distant past? No.   Prior Inpatient Therapy:  as above  Prior Outpatient Therapy:  has participated in [facility] last year. Has been active in [facility] in the past.    Alcohol Screening: Patient refused Alcohol Screening Tool: Yes Substance Abuse History in the last 12 months:  denies drug abuse, reports history of alcohol dependence.  Consequences of Substance Abuse: 1 DUI ( 2007) , no history of seizures, reports history of broken relationships related to his alcohol use disorder  Previous Psychotropic Medications:  Has not been on any psychiatric medication trials  in the past  Psychological Evaluations:  No  Past Medical History: denies , NKDA, smokes 1 PPD  Past Medical History:  Diagnosis Date  . Alcohol abuse   . Anxiety   . Depression   . Insomnia    History reviewed. No pertinent surgical history. Family History:  Parents alive, divorced, live out of state (  [place]), has two siblings  Family History  Problem Relation Age of Onset  . Alcohol abuse Paternal Uncle   . Alcohol abuse Maternal Grandfather   . Depression Mother   . Alcohol abuse Maternal Uncle   . Alcohol abuse Paternal Grandfather    Family Psychiatric  History: states there is a strong history of alcohol dependence in family - see above . Denies mental illness in family, no suicides in family  Tobacco Screening: Have you used any form of tobacco in the last 30 days? (Cigarettes, Smokeless Tobacco, Cigars, and/or Pipes): Patient Refused Screening Social History:  History  Alcohol Use  . 48.0 oz/week  . 12 Shots of liquor per week    Comment: heavy     History  Drug Use No    Additional Social History: Marital status: Separated Separated, when?: 2 years ago What types of issues is patient dealing with in the relationship?: "I have some held over resentment" Are you sexually active?: No What is your sexual orientation?: heterosexual Does patient have children?: Yes How many children?: 1 How is patient's relationship with their children?: He has a good relationship, but admitted he hasn't been a very good dad for the last few months, see her once a week    Withdrawal Symptoms: Anorexia, Nausea / Vomiting, Sweats, Cramps, Tingling, Tremors, Agitation  Allergies:  No Known Allergies Lab Results:  Results for orders placed or performed during the hospital encounter of 06/30/16 (from the past 48 hour(s))  CBC with Differential     Status: Abnormal   Collection Time: 06/30/16  8:06 PM  Result Value Ref Range   WBC 7.8 4.0 - 10.5 K/uL   RBC 5.33 4.22 - 5.81 MIL/uL   Hemoglobin 17.7 (H) 13.0 - 17.0 g/dL   HCT 49.2 39.0 - 52.0 %   MCV 92.3 78.0 - 100.0 fL   MCH 33.2 26.0 - 34.0 pg   MCHC 36.0 30.0 - 36.0 g/dL   RDW 13.3 11.5 - 15.5 %   Platelets 95 (L) 150 - 400 K/uL    Comment: REPEATED TO VERIFY SPECIMEN CHECKED FOR CLOTS PLATELET COUNT CONFIRMED BY SMEAR     Neutrophils Relative % 68 %   Neutro Abs 5.3 1.7 - 7.7 K/uL   Lymphocytes Relative 26 %   Lymphs Abs 2.0 0.7 - 4.0 K/uL   Monocytes Relative 6 %   Monocytes Absolute 0.5 0.1 - 1.0 K/uL   Eosinophils Relative 0 %   Eosinophils Absolute 0.0 0.0 - 0.7 K/uL   Basophils Relative 0 %   Basophils Absolute 0.0 0.0 - 0.1 K/uL  Comprehensive metabolic panel     Status: Abnormal   Collection Time: 06/30/16  8:06 PM  Result Value Ref Range   Sodium 139 135 - 145 mmol/L   Potassium 4.1 3.5 - 5.1 mmol/L   Chloride 102 101 - 111 mmol/L   CO2 19 (L) 22 - 32 mmol/L   Glucose, Bld 96 65 - 99 mg/dL   BUN 12 6 - 20 mg/dL   Creatinine, Ser 0.78 0.61 - 1.24 mg/dL   Calcium 8.4 (L) 8.9 - 10.3 mg/dL   Total Protein 8.1 6.5 - 8.1 g/dL   Albumin 4.2 3.5 - 5.0 g/dL   AST 163 (H) 15 -  41 U/L   ALT 130 (H) 17 - 63 U/L   Alkaline Phosphatase 73 38 - 126 U/L   Total Bilirubin 0.6 0.3 - 1.2 mg/dL   GFR calc non Af Amer >60 >60 mL/min   GFR calc Af Amer >60 >60 mL/min    Comment: (NOTE) The eGFR has been calculated using the CKD EPI equation. This calculation has not been validated in all clinical situations. eGFR's persistently <60 mL/min signify possible Chronic Kidney Disease.    Anion gap 18 (H) 5 - 15  Ethanol     Status: Abnormal   Collection Time: 06/30/16  8:06 PM  Result Value Ref Range   Alcohol, Ethyl (B) 423 ([facility]) <5 mg/dL    Comment:        LOWEST DETECTABLE LIMIT FOR SERUM ALCOHOL IS 5 mg/dL FOR MEDICAL PURPOSES ONLY CRITICAL RESULT CALLED TO, READ BACK BY AND VERIFIED WITH: Satira Mccallum [id number] @ 2103 BY J SCOTTON   Rapid urine drug screen (hospital performed)     Status: None   Collection Time: 06/30/16  8:16 PM  Result Value Ref Range   Opiates NONE DETECTED NONE DETECTED   Cocaine NONE DETECTED NONE DETECTED   Benzodiazepines NONE DETECTED NONE DETECTED   Amphetamines NONE DETECTED NONE DETECTED   Tetrahydrocannabinol NONE DETECTED NONE DETECTED   Barbiturates NONE DETECTED  NONE DETECTED    Comment:        DRUG SCREEN FOR MEDICAL PURPOSES ONLY.  IF CONFIRMATION IS NEEDED FOR ANY PURPOSE, NOTIFY LAB WITHIN 5 DAYS.        LOWEST DETECTABLE LIMITS FOR URINE DRUG SCREEN Drug Class       Cutoff (ng/mL) Amphetamine      1000 Barbiturate      200 Benzodiazepine   287 Tricyclics       681 Opiates          300 Cocaine          300 THC              50     Blood Alcohol level:  Lab Results  Component Value Date   ETH 423 ([place]) 06/30/2016   ETH 421 ([facility]) 15/72/6203    Metabolic Disorder Labs:  No results found for: HGBA1C, MPG No results found for: PROLACTIN No results found for: CHOL, TRIG, HDL, CHOLHDL, VLDL, LDLCALC  Current Medications: Current Facility-Administered Medications  Medication Dose Route Frequency Provider Last Rate Last Dose  . acetaminophen (TYLENOL) tablet 650 mg  650 mg Oral Q6H PRN Patrecia Pour, NP   650 mg at 07/02/16 0810  . alum & mag hydroxide-simeth (MAALOX/MYLANTA) 200-200-20 MG/5ML suspension 30 mL  30 mL Oral Q4H PRN Patrecia Pour, NP      . hydrOXYzine (ATARAX/VISTARIL) tablet 25 mg  25 mg Oral Q6H PRN Kerrie Buffalo, NP   25 mg at 07/01/16 2016  . Influenza vac split quadrivalent PF (FLUARIX) injection 0.5 mL  0.5 mL Intramuscular Tomorrow-1000 Maurine Minister Simon, PA-C      . loperamide (IMODIUM) capsule 2-4 mg  2-4 mg Oral PRN Kerrie Buffalo, NP      . LORazepam (ATIVAN) tablet 1 mg  1 mg Oral Q6H PRN Kerrie Buffalo, NP   1 mg at 07/01/16 2016  . LORazepam (ATIVAN) tablet 1 mg  1 mg Oral QID Kerrie Buffalo, NP   1 mg at 07/02/16 1157   Followed by  . [START ON 07/03/2016] LORazepam (ATIVAN) tablet 1 mg  1 mg Oral  TID Kerrie Buffalo, NP       Followed by  . [START ON 07/04/2016] LORazepam (ATIVAN) tablet 1 mg  1 mg Oral BID Kerrie Buffalo, NP       Followed by  . [START ON 07/06/2016] LORazepam (ATIVAN) tablet 1 mg  1 mg Oral Daily Kerrie Buffalo, NP      . magnesium hydroxide (MILK OF MAGNESIA) suspension 30 mL  30 mL Oral  Daily PRN Patrecia Pour, NP      . multivitamin with minerals tablet 1 tablet  1 tablet Oral Daily Kerrie Buffalo, NP   1 tablet at 07/02/16 0806  . nicotine (NICODERM CQ - dosed in mg/24 hours) patch 21 mg  21 mg Transdermal Daily Kerrie Buffalo, NP   21 mg at 07/02/16 0807  . ondansetron (ZOFRAN-ODT) disintegrating tablet 4 mg  4 mg Oral Q6H PRN Kerrie Buffalo, NP      . thiamine (VITAMIN B-1) tablet 100 mg  100 mg Oral Daily Kerrie Buffalo, NP   100 mg at 07/02/16 0809   PTA Medications: Prescriptions Prior to Admission  Medication Sig Dispense Refill Last Dose  . chlordiazePOXIDE (LIBRIUM) 25 MG capsule 17m PO TID x 1D, then 25-523mPO BID X 1D, then 25-5058mO QD X 1D (Patient not taking: Reported on 06/30/2016) 10 capsule 0 Not Taking at Unknown time  . diphenhydramine-acetaminophen (TYLENOL PM) 25-500 MG TABS tablet Take 1 tablet by mouth at bedtime as needed.   Past Month at Unknown time  . nicotine (NICODERM CQ - DOSED IN MG/24 HOURS) 21 mg/24hr patch Place 1 patch (21 mg total) onto the skin daily. (Patient not taking: Reported on 05/05/2016) 28 patch 0 Not Taking at Unknown time  . thiamine 100 MG tablet Take 1 tablet (100 mg total) by mouth daily. (Patient not taking: Reported on 05/05/2016) 30 tablet 0 Not Taking at Unknown time    Musculoskeletal: Strength & Muscle Tone: within normal limits mild tremors, no restlessness  Gait & Station: normal Patient leans: N/A  Psychiatric Specialty Exam: Physical Exam  Review of Systems  Constitutional: Negative.        Feels somewhat " sweaty"   HENT: Negative.   Eyes: Negative.   Respiratory: Negative.   Cardiovascular: Negative.   Gastrointestinal: Positive for diarrhea and nausea. Negative for blood in stool, melena and vomiting.  Genitourinary: Negative.   Musculoskeletal: Negative.   Skin: Negative.   Neurological: Negative for seizures.  Endo/Heme/Allergies: Negative.   Psychiatric/Behavioral: Positive for substance  abuse.    Blood pressure 134/90, pulse (!) 104, temperature 97.5 F (36.4 C), temperature source Oral, resp. rate 18, height '6\' 4"'  (1.93 m), weight 98.9 kg (218 lb), SpO2 98 %.Body mass index is 26.54 kg/m.  General Appearance: Fairly Groomed  Eye Contact:  Good  Speech:  Normal Rate  Volume:  Normal  Mood:  mildly depressed  Affect:  vaguely anxious, but reactive   Thought Process:  Linear  Orientation:  Other:  fully alert and attentive   Thought Content:  no hallucinations, no delusions , not internally preoccupied   Suicidal Thoughts:  No denies any suicidal or self injurious ideations, denies any homicidal or violent ideations   Homicidal Thoughts:  No  Memory:  recent and remote grossly intact   Judgement:  Fair  Insight:  Present  Psychomotor Activity:  no diaphoresis, slightly  tremulous, flushed    Concentration:  Concentration: Good and Attention Span: Good  Recall:  Good  Fund of Knowledge:  Good  Language:  Good  Akathisia:  Negative  Handed:  Right  AIMS (if indicated):     Assets:  Communication Skills Desire for Improvement Resilience  ADL's:  Intact  Cognition:  WNL  Sleep:  Number of Hours: 6.75    Treatment Plan Summary: Daily contact with patient to assess and evaluate symptoms and progress in treatment, Medication management, Plan inpatient admission  and medications as below  Observation Level/Precautions:  15 minute checks  Laboratory:  As needed   Psychotherapy:  Milieu, group therapy   Medications:  Currently on Ativan detox protocol .   Consultations:  As needed   Discharge Concerns:  -  Estimated LOS: 5 days   Other:  Expresses interest in going to a residential inpatient alcohol rehabilitation center after discharge from unit    Physician Treatment Plan for Primary Diagnosis: Alcohol Dependence Long Term Goal(s): Improvement in symptoms so as ready for discharge  Short Term Goals: Ability to identify triggers associated with substance  abuse/mental health issues will improve  Physician Treatment Plan for Secondary Diagnosis: Alcohol Induced Mood Disorder   Long Term Goal(s): Improvement in symptoms so as ready for discharge  Short Term Goals: Ability to verbalize feelings will improve, Ability to disclose and discuss suicidal ideas, Ability to demonstrate self-control will improve and Ability to identify and develop effective coping behaviors will improve  I certify that inpatient services furnished can reasonably be expected to improve the patient's condition.    Neita Garnet, MD 2/13/20184:36 PM

## 2016-07-02 NOTE — Tx Team (Signed)
Interdisciplinary Treatment and Diagnostic Plan Update  07/02/2016 Time of Session: 0900am STEVE YOUNGBERG MRN: [id number]  Principal Diagnosis: <principal problem not specified>  Secondary Diagnoses: Active Problems:   Major depressive disorder, single episode, severe without psychosis ([facility])   Current Medications:  Current Facility-Administered Medications  Medication Dose Route Frequency Provider Last Rate Last Dose  . acetaminophen (TYLENOL) tablet 650 mg  650 mg Oral Q6H PRN Charm Rings, NP   650 mg at 07/02/16 0810  . alum & mag hydroxide-simeth (MAALOX/MYLANTA) 200-200-20 MG/5ML suspension 30 mL  30 mL Oral Q4H PRN Charm Rings, NP      . hydrOXYzine (ATARAX/VISTARIL) tablet 25 mg  25 mg Oral Q6H PRN Adonis Brook, NP   25 mg at 07/01/16 2016  . Influenza vac split quadrivalent PF (FLUARIX) injection 0.5 mL  0.5 mL Intramuscular Tomorrow-1000 Mena Goes Simon, PA-C      . loperamide (IMODIUM) capsule 2-4 mg  2-4 mg Oral PRN Adonis Brook, NP      . LORazepam (ATIVAN) tablet 1 mg  1 mg Oral Q6H PRN Adonis Brook, NP   1 mg at 07/01/16 2016  . LORazepam (ATIVAN) tablet 1 mg  1 mg Oral QID Adonis Brook, NP   1 mg at 07/02/16 1708   Followed by  . [START ON 07/03/2016] LORazepam (ATIVAN) tablet 1 mg  1 mg Oral TID Adonis Brook, NP       Followed by  . [START ON 07/04/2016] LORazepam (ATIVAN) tablet 1 mg  1 mg Oral BID Adonis Brook, NP       Followed by  . [START ON 07/06/2016] LORazepam (ATIVAN) tablet 1 mg  1 mg Oral Daily Adonis Brook, NP      . magnesium hydroxide (MILK OF MAGNESIA) suspension 30 mL  30 mL Oral Daily PRN Charm Rings, NP      . multivitamin with minerals tablet 1 tablet  1 tablet Oral Daily Adonis Brook, NP   1 tablet at 07/02/16 0806  . nicotine (NICODERM CQ - dosed in mg/24 hours) patch 21 mg  21 mg Transdermal Daily Adonis Brook, NP   21 mg at 07/02/16 0807  . ondansetron (ZOFRAN-ODT) disintegrating tablet 4 mg  4 mg Oral Q6H PRN Adonis Brook, NP      . thiamine (VITAMIN B-1) tablet 100 mg  100 mg Oral Daily Adonis Brook, NP   100 mg at 07/02/16 0809   PTA Medications: Prescriptions Prior to Admission  Medication Sig Dispense Refill Last Dose  . chlordiazePOXIDE (LIBRIUM) 25 MG capsule 50mg  PO TID x 1D, then 25-50mg  PO BID X 1D, then 25-50mg  PO QD X 1D (Patient not taking: Reported on 06/30/2016) 10 capsule 0 Not Taking at Unknown time  . diphenhydramine-acetaminophen (TYLENOL PM) 25-500 MG TABS tablet Take 1 tablet by mouth at bedtime as needed.   Past Month at Unknown time  . nicotine (NICODERM CQ - DOSED IN MG/24 HOURS) 21 mg/24hr patch Place 1 patch (21 mg total) onto the skin daily. (Patient not taking: Reported on 05/05/2016) 28 patch 0 Not Taking at Unknown time  . thiamine 100 MG tablet Take 1 tablet (100 mg total) by mouth daily. (Patient not taking: Reported on 05/05/2016) 30 tablet 0 Not Taking at Unknown time    Patient Stressors: Substance abuse  Patient Strengths: Average or above average intelligence Capable of independent living Communication skills  Treatment Modalities: Medication Management, Group therapy, Case management,  1 to 1 session with clinician, Psychoeducation, Recreational  therapy.   Physician Treatment Plan for Primary Diagnosis: <principal problem not specified> Long Term Goal(s): Improvement in symptoms so as ready for discharge Improvement in symptoms so as ready for discharge   Short Term Goals: Ability to identify triggers associated with substance abuse/mental health issues will improve Ability to verbalize feelings will improve Ability to disclose and discuss suicidal ideas Ability to demonstrate self-control will improve Ability to identify and develop effective coping behaviors will improve  Medication Management: Evaluate patient's response, side effects, and tolerance of medication regimen.  Therapeutic Interventions: 1 to 1 sessions, Unit Group sessions and Medication  administration.  Evaluation of Outcomes: Progressing  Physician Treatment Plan for Secondary Diagnosis: Active Problems:   Major depressive disorder, single episode, severe without psychosis (HCC)  Long Term Goal(s): Improvement in symptoms so as ready for discharge Improvement in symptoms so as ready for discharge   Short Term Goals: Ability to identify triggers associated with substance abuse/mental health issues will improve Ability to verbalize feelings will improve Ability to disclose and discuss suicidal ideas Ability to demonstrate self-control will improve Ability to identify and develop effective coping behaviors will improve     Medication Management: Evaluate patient's response, side effects, and tolerance of medication regimen.  Therapeutic Interventions: 1 to 1 sessions, Unit Group sessions and Medication administration.  Evaluation of Outcomes: Progressing   RN Treatment Plan for Primary Diagnosis: <principal problem not specified> Long Term Goal(s): Knowledge of disease and therapeutic regimen to maintain health will improve  Short Term Goals: Ability to remain free from injury will improve and Ability to verbalize frustration and anger appropriately will improve  Medication Management: RN will administer medications as ordered by provider, will assess and evaluate patient's response and provide education to patient for prescribed medication. RN will report any adverse and/or side effects to prescribing provider.  Therapeutic Interventions: 1 on 1 counseling sessions, Psychoeducation, Medication administration, Evaluate responses to treatment, Monitor vital signs and CBGs as ordered, Perform/monitor CIWA, COWS, AIMS and Fall Risk screenings as ordered, Perform wound care treatments as ordered.  Evaluation of Outcomes: Progressing   LCSW Treatment Plan for Primary Diagnosis: <principal problem not specified> Long Term Goal(s): Safe transition to appropriate next  level of care at discharge, Engage patient in therapeutic group addressing interpersonal concerns.  Short Term Goals: Engage patient in aftercare planning with referrals and resources  Therapeutic Interventions: Assess for all discharge needs, 1 to 1 time with Social worker, Explore available resources and support systems, Assess for adequacy in community support network, Educate family and significant other(s) on suicide prevention, Complete Psychosocial Assessment, Interpersonal group therapy.  Evaluation of Outcomes: Progressing   Progress in Treatment: Attending groups: Yes. Participating in groups: Yes. Taking medication as prescribed: Yes. Toleration medication: Yes. Family/Significant other contact made: No, will contact:  mother Patient understands diagnosis: Yes. Discussing patient identified problems/goals with staff: Yes. Medical problems stabilized or resolved: Yes. Denies suicidal/homicidal ideation: No. Issues/concerns per patient self-inventory: Yes. Other:   New problem(s) identified: No, Describe:  none reported  New Short Term/Long Term Goal(s):  Discharge Plan or Barriers:  Assessing for referrals, awaiting insurance information for [facility].  Reason for Continuation of Hospitalization: Depression Medication stabilization Withdrawal symptoms  Estimated Length of Stay:  Attendees: Patient: 07/02/2016 5:11 PM  Physician: Jama Flavorsobos, MD 07/02/2016 5:11 PM  Nursing: Meriam SpragueBeverly, RN 07/02/2016 5:11 PM  RN Care Manager:  Victorino DikeJennifer, RN 07/02/2016 5:11 PM  Social Worker: Dahlia ClientHannah, Alexander MtLCSW 07/02/2016 5:11 PM  Recreational  Therapist:  07/02/2016 5:11 PM  Other:  07/02/2016 5:11 PM  Other:  07/02/2016 5:11 PM  Other: 07/02/2016 5:11 PM    Scribe for Treatment Team: Raye Sorrow, LCSW 07/02/2016 5:11 PM

## 2016-07-02 NOTE — Progress Notes (Signed)
D: Pt was in the dayroom upon initial approach.  Pt presents with depressed affect and depressed, pleasant mood.  Pt denies SI/HI, denies hallucinations, reports pain from headache of 4/10, reports withdrawal symptoms of tremors, sweats.  He reports he had a "real good day" and his goal was to "get the blood pressure down."  Pt's blood pressure is decreased compared to earlier today.  Pt has been visible in milieu interacting with peers and staff appropriately.  Pt attended evening group.    A: Introduced self to pt.  Actively listened to pt and offered support and encouragement. Medication administered per order.  Pt denies need for pain medication related to headache.  Q15 minute safety checks maintained.    R: Pt is safe on the unit.  Pt is compliant with medication.  Pt verbally contracts for safety.

## 2016-07-02 NOTE — Plan of Care (Signed)
Problem: Medication: Goal: Compliance with prescribed medication regimen will improve Outcome: Progressing Pt has been compliant with medication regimen tonight.   

## 2016-07-03 DIAGNOSIS — F17210 Nicotine dependence, cigarettes, uncomplicated: Secondary | ICD-10-CM

## 2016-07-03 DIAGNOSIS — F1099 Alcohol use, unspecified with unspecified alcohol-induced disorder: Secondary | ICD-10-CM

## 2016-07-03 MED ORDER — TRAZODONE HCL 50 MG PO TABS
50.0000 mg | ORAL_TABLET | Freq: Every evening | ORAL | Status: DC | PRN
  Administered 2016-07-03 – 2016-07-07 (×9): 50 mg via ORAL
  Filled 2016-07-03: qty 1
  Filled 2016-07-03: qty 14
  Filled 2016-07-03: qty 1
  Filled 2016-07-03: qty 14
  Filled 2016-07-03 (×9): qty 1
  Filled 2016-07-03: qty 14
  Filled 2016-07-03 (×2): qty 1
  Filled 2016-07-03: qty 14
  Filled 2016-07-03 (×2): qty 1

## 2016-07-03 MED ORDER — NALTREXONE HCL 50 MG PO TABS
50.0000 mg | ORAL_TABLET | Freq: Every day | ORAL | Status: DC
  Administered 2016-07-03 – 2016-07-08 (×6): 50 mg via ORAL
  Filled 2016-07-03: qty 1
  Filled 2016-07-03 (×2): qty 7
  Filled 2016-07-03 (×6): qty 1

## 2016-07-03 MED ORDER — LORAZEPAM 1 MG PO TABS
2.0000 mg | ORAL_TABLET | Freq: Three times a day (TID) | ORAL | Status: AC
  Administered 2016-07-03 – 2016-07-04 (×2): 2 mg via ORAL
  Filled 2016-07-03 (×2): qty 2

## 2016-07-03 MED ORDER — LORAZEPAM 1 MG PO TABS: 1.0000 mg | ORAL_TABLET | Freq: Every day | ORAL | Status: DC

## 2016-07-03 MED ORDER — LORAZEPAM 1 MG PO TABS: 2.0000 mg | ORAL_TABLET | Freq: Four times a day (QID) | ORAL | Status: AC | PRN

## 2016-07-03 MED ORDER — IBUPROFEN 600 MG PO TABS
600.0000 mg | ORAL_TABLET | Freq: Four times a day (QID) | ORAL | Status: DC | PRN
  Administered 2016-07-03 – 2016-07-05 (×4): 600 mg via ORAL
  Filled 2016-07-03 (×4): qty 1

## 2016-07-03 MED ORDER — LORAZEPAM 1 MG PO TABS: 1.0000 mg | ORAL_TABLET | Freq: Two times a day (BID) | ORAL | Status: DC

## 2016-07-03 NOTE — Progress Notes (Signed)
[facility] MD Progress Note  07/03/2016 2:53 PM Christopher Reid  MRN:  [id number] Subjective:   36 yo Caucasian male, single, lives alone, unemployed. Background history of Alcohol Use Disorder. Presented to the ER in company of his family. Intoxicated at presentation with BAL 423 mg/dl. Admitted for alcohol detox.  At interview, he reports daily drinking. Has become heavy since he quit his job. Alcohol use has affected his ability to sustain employment recently. Notes it has affected his relationships over the years. His highschool sweetheart left him years ago due to heavy alcohol use. His daughter's mom left him couple of years ago. He is estranged from his daughter as a result of the effects of using alcohol. Patient reports that his family has been concerned and decided to assist him get back on his feet.  He has been coming off alcohol relatively smoothly. Has mild shakes and has been having diarrhea. No nausea, retching or vomiting. No headcheese. No hallucination in any modality. No feeling of persecution. No suicidal thoughts. No thoughts of violence. Patient plans to get into residential treatment post detox.  Historically started drinking in college. Had IOP about years ago. Says he was sober for eight months. AA helped then. He has a sponsor and he is still connected to his local AA .    Nursing staff notes he is in withdrawals. Pulse is elevated. No seizures. Has not been observed to be hallucinating. His mood is getting better as he is coming off alcohol. He has participated at some of the unit groups.  Principal Problem: Alcohol Use Disorder Diagnosis:   Patient Active Problem List   Diagnosis Date Noted  . Major depressive disorder, recurrent severe without psychotic features (HCC) [F33.2] 07/01/2016  . Major depressive disorder, single episode, severe without psychosis (HCC) [F32.2] 07/01/2016  . Acute alcoholic hepatitis [K70.10] 09/18/2015  . Substance induced mood disorder (HCC)  [F19.94] 09/18/2015  . Chronic post-traumatic stress disorder (PTSD) [F43.12] 09/18/2015  . Alcohol use disorder, severe, dependence (HCC) [F10.20] 09/17/2015  . Alcohol-induced mood disorder (HCC) [F10.94] 09/09/2015  . Alcohol dependence with uncomplicated withdrawal (HCC) [F10.230] 09/09/2015  . Alcohol withdrawal (HCC) [F10.239] 09/09/2015  . Tobacco abuse [Z72.0] 09/09/2015  . Depression [F32.9] 09/09/2015  . Elevated LFTs [R79.89] 09/09/2015  . Hypertension [I10] 09/09/2015  . Panic attack [F41.0] 03/16/2015  . Alcohol abuse [F10.10] 03/16/2015   Total Time spent with patient: 20 minutes  Past Psychiatric History: No past history of mental illness. Has never been on psychotropic medication. No past history of suicide. No past history of violent behavior.   Past Medical History:  Past Medical History:  Diagnosis Date  . Alcohol abuse   . Anxiety   . Depression   . Insomnia    History reviewed. No pertinent surgical history. Family History:  Family History  Problem Relation Age of Onset  . Alcohol abuse Paternal Uncle   . Alcohol abuse Maternal Grandfather   . Depression Mother   . Alcohol abuse Maternal Uncle   . Alcohol abuse Paternal Grandfather    Family Psychiatric  History: Family history of addiction. No family history of mental illness or suicide.  Social History:  History  Alcohol Use  . 48.0 oz/week  . 80 Shots of liquor per week    Comment: heavy     History  Drug Use No    Social History   Social History  . Marital status: Single    Spouse name: N/A  . Number of children: N/A  .  Years of education: N/A   Social History Main Topics  . Smoking status: Current Every Day Reid    Packs/day: 0.50  . Smokeless tobacco: Never Used  . Alcohol use 48.0 oz/week    80 Shots of liquor per week     Comment: heavy  . Drug use: No  . Sexual activity: Not Currently   Other Topics Concern  . None   Social History Narrative  . None   Additional  Social History:    Withdrawal Symptoms: Anorexia, Nausea / Vomiting, Sweats, Cramps, Tingling, Tremors, Agitation    Sleep: Better  Appetite:  Fair  Current Medications: Current Facility-Administered Medications  Medication Dose Route Frequency Provider Last Rate Last Dose  . alum & mag hydroxide-simeth (MAALOX/MYLANTA) 200-200-20 MG/5ML suspension 30 mL  30 mL Oral Q4H PRN Charm Rings, NP      . hydrOXYzine (ATARAX/VISTARIL) tablet 25 mg  25 mg Oral Q6H PRN Adonis Brook, NP   25 mg at 07/01/16 2016  . ibuprofen (ADVIL,MOTRIN) tablet 600 mg  600 mg Oral Q6H PRN Kerry Hough, PA-C   600 mg at 07/03/16 0846  . loperamide (IMODIUM) capsule 2-4 mg  2-4 mg Oral PRN Adonis Brook, NP   4 mg at 07/03/16 0846  . LORazepam (ATIVAN) tablet 1 mg  1 mg Oral Q6H PRN Adonis Brook, NP   1 mg at 07/01/16 2016  . LORazepam (ATIVAN) tablet 1 mg  1 mg Oral TID Adonis Brook, NP   1 mg at 07/03/16 1200   Followed by  . [START ON 07/04/2016] LORazepam (ATIVAN) tablet 1 mg  1 mg Oral BID Adonis Brook, NP       Followed by  . [START ON 07/06/2016] LORazepam (ATIVAN) tablet 1 mg  1 mg Oral Daily Adonis Brook, NP      . magnesium hydroxide (MILK OF MAGNESIA) suspension 30 mL  30 mL Oral Daily PRN Charm Rings, NP      . multivitamin with minerals tablet 1 tablet  1 tablet Oral Daily Adonis Brook, NP   1 tablet at 07/03/16 0844  . nicotine (NICODERM CQ - dosed in mg/24 hours) patch 21 mg  21 mg Transdermal Daily Adonis Brook, NP   21 mg at 07/03/16 0846  . ondansetron (ZOFRAN-ODT) disintegrating tablet 4 mg  4 mg Oral Q6H PRN Adonis Brook, NP      . thiamine (VITAMIN B-1) tablet 100 mg  100 mg Oral Daily Adonis Brook, NP   100 mg at 07/03/16 1610    Lab Results: No results found for this or any previous visit (from the past 48 hour(s)).  Blood Alcohol level:  Lab Results  Component Value Date   ETH 423 (HH) 06/30/2016   ETH 421 (HH) 05/05/2016    Metabolic Disorder Labs: No  results found for: HGBA1C, MPG No results found for: PROLACTIN No results found for: CHOL, TRIG, HDL, CHOLHDL, VLDL, LDLCALC  Physical Findings: AIMS:  , ,  ,  ,    CIWA:  CIWA-Ar Total: 3 COWS:     Musculoskeletal: Strength & Muscle Tone: within normal limits Gait & Station: normal Patient leans: N/A  Psychiatric Specialty Exam: Physical Exam  Constitutional: He is oriented to person, place, and time.  HENT:  Head: Normocephalic and atraumatic.  Eyes: Conjunctivae and EOM are normal. Pupils are equal, round, and reactive to light.  Neck: Normal range of motion. Neck supple.  Cardiovascular:  tachycardia  Respiratory: Effort normal and breath sounds normal.  GI: Bowel sounds are normal.  Musculoskeletal: Normal range of motion.  Neurological: He is alert and oriented to person, place, and time.  Skin: He is diaphoretic.  sweaty  Psychiatric:  As above    ROS  Blood pressure 123/87, pulse (!) 117, temperature 98.5 F (36.9 C), temperature source Oral, resp. rate 16, height 6\' 4"  (1.93 m), weight 98.9 kg (218 lb), SpO2 98 %.Body mass index is 26.54 kg/m.  General Appearance: Flushed, mild tremor. No opthalmoplegia. No nystagmus. Appropriate and cooperative.   Eye Contact:  Good  Speech:  Clear and Coherent and Normal Rate  Volume:  Normal  Mood:  Feeling less anxious. Less depressed  Affect:  Appropriate and Full Range  Thought Process:  Goal Directed  Orientation:  Full (Time, Place, and Person)  Thought Content:  Focused on recovery. No thoughts of violence. No hallucination in any modality. No delusional theme.   Suicidal Thoughts:  No  Homicidal Thoughts:  No  Memory:  Immediate;   Good Recent;   Good Remote;   Good  Judgement:  Good  Insight:  Good  Psychomotor Activity:  Normal  Concentration:  Concentration: Good and Attention Span: Good  Recall:  Good  Fund of Knowledge:  Good  Language:  Good  Akathisia:  No  Handed:    AIMS (if indicated):      Assets:  Communication Skills Desire for Improvement Financial Resources/Insurance Housing Resilience  ADL's:  Impaired  Cognition:  WNL  Sleep:  Number of Hours: 6.75     Treatment Plan Summary:  Patient is coming off alcohol smoothly. He is not psychotic. He is not suicidal. He is not depressed. He is a bit anxious as reality is gradually setting in. He is focused on recovery. We discussed use of Naltrexone as an anti-craving agent. Patient consented to treatment after we reviewed the risks and benefits.  Plan: 1. Increase Ativan to 2 mg 2. Continue routine CIWA protocol 3. Naltrexone 50 mg daily 4. Continue to monitor mood, behavior and interaction with peers 5. Encourage groups and unit activities 6. SW would facilitate residential addiction treatment.   Georgiann CockerVincent A Izediuno, MD 07/03/2016, 2:53 PM

## 2016-07-03 NOTE — Progress Notes (Signed)
Recreation Therapy Notes  Date: 07/03/16 Time: 0930 Location: [address] Dayroom  Group Topic: Stress Management  Goal Area(s) Addresses:  Patient will verbalize importance of using healthy stress management.  Patient will identify positive emotions associated with healthy stress management.   Behavioral Response: Engaged  Intervention: Stress Management  Activity :  Resilience Meditation.  LRT introduced the stress management technique of meditation.  LRT play the meditation from the Calm app to allow patients to engage in the activity.  Patients were to follow along as the meditation was played to fully engage in the technique.  Education:  Stress Management, Discharge Planning.   Education Outcome: Acknowledges edcuation/In group clarification offered/Needs additional education  Clinical Observations/Feedback: Pt attended group.   Shiela Bruns, LRT/CTRS         Celestine Bougie A 07/03/2016 12:16 PM 

## 2016-07-03 NOTE — Progress Notes (Signed)
Pt attended the evening NA speaker meeting. Pt was engaged and appropriate. Nyah Shepherd C, NT 07/03/16 10:41 PM  

## 2016-07-03 NOTE — [facility] Group Notes (Signed)
[facility]  07/03/2016 11:46 AM  Type of Therapy:  Group Therapy  Participation Level:  Did Not Attend    Raye SorrowCoble, Hodges Treiber N 07/03/2016, 11:46 AM

## 2016-07-03 NOTE — Progress Notes (Signed)
Patient ID: Christopher Reid, male   DOB: 1981-04-30, 36 y.o.   MRN: [id number]  DAR: Pt. Denies SI/HI and A/V Hallucinations. He reports sleep is poor, appetite is fair, energy level is normal, and concentration is good. He rates depression 2/10, hopelessness 2/10, and anxiety 4/10. He reports withdrawal symptoms including tremors, diarrhea, and runny nose. He also reports a headache. Patient received PRN Ibuprofen and Imodium which provided relief. He is cooperative and pleasant during interaction with this Clinical research associatewriter but minimal. Support and encouragement provided to the patient. Scheduled medications administered to patient per physician's orders. Q15 minute checks are maintained for safety.

## 2016-07-03 NOTE — Plan of Care (Signed)
Problem: Activity: Goal: Sleeping patterns will improve Outcome: Progressing Obtained order for sleep aid per Pt. request. Will re-eval in a.m.

## 2016-07-04 MED ORDER — LORAZEPAM 1 MG PO TABS
1.0000 mg | ORAL_TABLET | Freq: Every day | ORAL | Status: AC
  Administered 2016-07-06: 1 mg via ORAL
  Filled 2016-07-04: qty 1

## 2016-07-04 MED ORDER — LORAZEPAM 1 MG PO TABS
2.0000 mg | ORAL_TABLET | Freq: Two times a day (BID) | ORAL | Status: AC
  Administered 2016-07-04 – 2016-07-05 (×2): 2 mg via ORAL
  Filled 2016-07-04 (×2): qty 2

## 2016-07-04 NOTE — Progress Notes (Signed)
[facility] MD Progress Note  07/04/2016 11:57 AM Christopher Reid  MRN:  [id number] Subjective:   36 yo Caucasian male, single, lives alone, unemployed. Background history of Alcohol Use Disorder. Presented to the ER in company of his family. Intoxicated at presentation with BAL 423 mg/dl. Admitted for alcohol detox.  Nursing staff reports that he still has tremors and sweatiness. His blood pressure is still high. He did not sleep well last night. Environmental factors played some role. He has been interacting well with peers. No behavioral issues. He has not voiced any futility thoughts. He has not been observed to be internally stimulated.   At interview, patient feels better today. No hallucination in nay modality. No GI symptoms. No feeling of restlessness. He does not have any thoughts of self harm. No side effects from his medications. Says he feels like he should get home before going into treatment. Says he has a dog and some other things to take care of. His mom is currently taking care of his dog. Says is mom would be going back to [place] tomorrow. Patient understands that he is still in active withdrawals. Our goal is to supervise complete resolution in a safe setting.   SW is working closely with his family towards smooth transition into residential treatment.   Principal Problem: Alcohol Use Disorder Diagnosis:   Patient Active Problem List   Diagnosis Date Noted  . Major depressive disorder, recurrent severe without psychotic features (HCC) [F33.2] 07/01/2016  . Major depressive disorder, single episode, severe without psychosis (HCC) [F32.2] 07/01/2016  . Acute alcoholic hepatitis [K70.10] 09/18/2015  . Substance induced mood disorder (HCC) [F19.94] 09/18/2015  . Chronic post-traumatic stress disorder (PTSD) [F43.12] 09/18/2015  . Alcohol use disorder, severe, dependence (HCC) [F10.20] 09/17/2015  . Alcohol-induced mood disorder (HCC) [F10.94] 09/09/2015  . Alcohol dependence with  uncomplicated withdrawal (HCC) [F10.230] 09/09/2015  . Alcohol withdrawal (HCC) [F10.239] 09/09/2015  . Tobacco abuse [Z72.0] 09/09/2015  . Depression [F32.9] 09/09/2015  . Elevated LFTs [R79.89] 09/09/2015  . Hypertension [I10] 09/09/2015  . Panic attack [F41.0] 03/16/2015  . Alcohol abuse [F10.10] 03/16/2015   Total Time spent with patient: 20 minutes  Past Psychiatric History: No past history of mental illness. Has never been on psychotropic medication. No past history of suicide. No past history of violent behavior.   Past Medical History:  Past Medical History:  Diagnosis Date  . Alcohol abuse   . Anxiety   . Depression   . Insomnia    History reviewed. No pertinent surgical history. Family History:  Family History  Problem Relation Age of Onset  . Alcohol abuse Paternal Uncle   . Alcohol abuse Maternal Grandfather   . Depression Mother   . Alcohol abuse Maternal Uncle   . Alcohol abuse Paternal Grandfather    Family Psychiatric  History: Family history of addiction. No family history of mental illness or suicide.  Social History:  History  Alcohol Use  . 48.0 oz/week  . 80 Shots of liquor per week    Comment: heavy     History  Drug Use No    Social History   Social History  . Marital status: Single    Spouse name: N/A  . Number of children: N/A  . Years of education: N/A   Social History Main Topics  . Smoking status: Current Every Day Smoker    Packs/day: 0.50  . Smokeless tobacco: Never Used  . Alcohol use 48.0 oz/week    80 Shots of liquor  per week     Comment: heavy  . Drug use: No  . Sexual activity: Not Currently   Other Topics Concern  . None   Social History Narrative  . None   Additional Social History:    Withdrawal Symptoms: Anorexia, Nausea / Vomiting, Sweats, Cramps, Tingling, Tremors, Agitation    Sleep: Poor last night  Appetite: Better  Current Medications: Current Facility-Administered Medications  Medication Dose  Route Frequency Provider Last Rate Last Dose  . alum & mag hydroxide-simeth (MAALOX/MYLANTA) 200-200-20 MG/5ML suspension 30 mL  30 mL Oral Q4H PRN Charm Rings, NP      . hydrOXYzine (ATARAX/VISTARIL) tablet 25 mg  25 mg Oral Q6H PRN Adonis Brook, NP   25 mg at 07/03/16 2223  . ibuprofen (ADVIL,MOTRIN) tablet 600 mg  600 mg Oral Q6H PRN Kerry Hough, PA-C   600 mg at 07/03/16 1721  . loperamide (IMODIUM) capsule 2-4 mg  2-4 mg Oral PRN Adonis Brook, NP   4 mg at 07/03/16 0846  . LORazepam (ATIVAN) tablet 2 mg  2 mg Oral BID Georgiann Cocker, MD       Followed by  . [START ON 07/06/2016] LORazepam (ATIVAN) tablet 1 mg  1 mg Oral Daily Georgiann Cocker, MD      . LORazepam (ATIVAN) tablet 2 mg  2 mg Oral Q6H PRN Georgiann Cocker, MD      . magnesium hydroxide (MILK OF MAGNESIA) suspension 30 mL  30 mL Oral Daily PRN Charm Rings, NP      . multivitamin with minerals tablet 1 tablet  1 tablet Oral Daily Adonis Brook, NP   1 tablet at 07/04/16 0842  . naltrexone (DEPADE) tablet 50 mg  50 mg Oral Daily Georgiann Cocker, MD   50 mg at 07/04/16 0842  . nicotine (NICODERM CQ - dosed in mg/24 hours) patch 21 mg  21 mg Transdermal Daily Adonis Brook, NP   21 mg at 07/04/16 0842  . ondansetron (ZOFRAN-ODT) disintegrating tablet 4 mg  4 mg Oral Q6H PRN Adonis Brook, NP      . thiamine (VITAMIN B-1) tablet 100 mg  100 mg Oral Daily Adonis Brook, NP   100 mg at 07/04/16 0843  . traZODone (DESYREL) tablet 50 mg  50 mg Oral QHS,MR X 1 Kerry Hough, PA-C   50 mg at 07/03/16 2223    Lab Results: No results found for this or any previous visit (from the past 48 hour(s)).  Blood Alcohol level:  Lab Results  Component Value Date   ETH 423 (HH) 06/30/2016   ETH 421 (HH) 05/05/2016    Metabolic Disorder Labs: No results found for: HGBA1C, MPG No results found for: PROLACTIN No results found for: CHOL, TRIG, HDL, CHOLHDL, VLDL, LDLCALC  Physical Findings: AIMS:  , ,  ,  ,     CIWA:  CIWA-Ar Total: 4 COWS:     Musculoskeletal: Strength & Muscle Tone: within normal limits Gait & Station: normal Patient leans: N/A  Psychiatric Specialty Exam: Physical Exam  Constitutional: He is oriented to person, place, and time.  HENT:  Head: Normocephalic and atraumatic.  Eyes: Conjunctivae and EOM are normal. Pupils are equal, round, and reactive to light.  Neck: Normal range of motion. Neck supple.  Cardiovascular:  tachycardia  Respiratory: Effort normal and breath sounds normal.  GI: Bowel sounds are normal.  Musculoskeletal: Normal range of motion.  Neurological: He is alert and oriented to person, place,  and time.  Skin: He is diaphoretic.  sweaty  Psychiatric:  As above    ROS  Blood pressure (!) 144/94, pulse 75, temperature 98.5 F (36.9 C), temperature source Oral, resp. rate 16, height 6\' 4"  (1.93 m), weight 98.9 kg (218 lb), SpO2 98 %.Body mass index is 26.54 kg/m.  General Appearance: Relatively calmer. Mild tremor still. Pleasant and engages well.   Eye Contact:  Good  Speech:  Clear and Coherent and Normal Rate  Volume:  Normal  Mood:  Feels calmer today  Affect:  Appropriate and Full Range  Thought Process:  Goal Directed  Orientation:  Full (Time, Place, and Person)  Thought Content:  Focused on recovery. No thoughts of violence. No hallucination in any modality. No delusional theme.   Suicidal Thoughts:  No  Homicidal Thoughts:  No  Memory:  Immediate;   Good Recent;   Good Remote;   Good  Judgement:  Good  Insight:  Good  Psychomotor Activity:  Normal  Concentration:  Concentration: Good and Attention Span: Good  Recall:  Good  Fund of Knowledge:  Good  Language:  Good  Akathisia:  No  Handed:    AIMS (if indicated):     Assets:  Communication Skills Desire for Improvement Financial Resources/Insurance Housing Resilience  ADL's:  Impaired  Cognition:  WNL  Sleep:  Number of Hours: 6     Treatment Plan  Summary:  Patient is still in withdrawals. He is not psychotic. He is not suicidal. He is still committed to residential treatment.   Plan: 1. Continue routine CIWA protocol 2. Continue to monitor mood, behavior and interaction with peers 3. Continue to encourage groups and unit activities    Georgiann CockerVincent A Izediuno, MD 07/04/2016, 11:57 AMPatient ID: Christopher Reid, male   DOB: 1981/02/09, 36 y.o.   MRN: [id number]

## 2016-07-04 NOTE — Progress Notes (Addendum)
  DATA ACTION RESPONSE  Objective- Pt. is up and visible in the milieu, interacting with peers and watching TV. Pt. presents with an anxious/worried affect and mood. Subjective- Denies having any SI/HI/AVH/Pain at this time. Pt. states "I would like to go home tomorrow in preparation for fellowship hall". Pt. continues to be cooperative and remain safe & pleasant on the unit.  1:1 interaction in private to establish rapport. Encouragement, education, & support given from staff. Meds. ordered and administered.   Safety maintained with Q 15 checks. Continues to follow treatment plan and will monitor closely. No additonal questions/concerns noted.

## 2016-07-04 NOTE — Progress Notes (Signed)
[facility] Group Notes:  (Nursing/MHT/Case Management/Adjunct)  Date:  07/04/2016  Time:  10:23 PM  Type of Therapy:  Psychoeducational Skills  Participation Level:  Active  Participation Quality:  Appropriate  Affect:  Appropriate  Cognitive:  Appropriate  Insight:  Appropriate  Engagement in Group:  Engaged  Modes of Intervention:  Education  Summary of Progress/Problems: The patient verbalized that he had a good day despite not having enough rest the night before. He attributed his positive day to being able to laugh with his peers. His goal for tomorrow is to talk to the social worker about getting discharged.   Hazle CocaGOODMAN, Lorely Bubb S 07/04/2016, 10:23 PM

## 2016-07-04 NOTE — Progress Notes (Signed)
D: Pt A & O X4. Denies SI, HI, AVH and pain. Presented irritable and anxious on initial approach this AM related to poor sleep due to room mates behavior. "I'm tired of this, I did not sleep last night, he kept opening the door, doing push ups and standing in the doorway; I couldn't sleep" reports normal energy, good appetite and good concentration level. Rates his depression 1/10, hopelessness 2/10 and anxiety 3/10 on self inventory sheet. Pt is also preoccupied about d/c. A: Safety checks maintained at Q 15 minutes intervals. All medications administered as prescribed. Emotional support and availability provided to pt. Encouraged pt to comply with current treatment regimen including unit  groups.  R: Pt did not attend groups as scheduled. Expressed frustration about not being d/c today related to some  bilateral hand tremors "he's keeping me here for another day, I just want to leave man, I want to go home". Off unit for meals with peers, returned without issues. POC continues for safety and mood stability.

## 2016-07-04 NOTE — [facility] Group Notes (Signed)
[facility] LCSW Group Therapy  07/04/2016 2:25 PM  Type of Therapy:  Group Therapy  Participation Level:  Did Not Attend    [place] N 07/04/2016, 2:25 PM 

## 2016-07-04 NOTE — Plan of Care (Signed)
Problem: Medication: Goal: Compliance with prescribed medication regimen will improve Outcome: Progressing Pt. take medications as prescribed.

## 2016-07-04 NOTE — Progress Notes (Signed)
  DATA ACTION RESPONSE  Objective- Pt. is up and visible in the milieu, sitting in dayroom watching TV. Pt. presents with a depressed/anxious affect and mood. Pt. was minimal and guarded with interaction. Subjective- Denies having any SI/HI/AVH/Pain at this time. Pt. states "I would like something to help me rest tonight". Pt. continues to be cooperative and remain safe & pleasant on the unit.  1:1 interaction in private to establish rapport. Encouragement, education, & support given from staff. Provider on call to obtained order for Trazodone. Meds. ordered and administered. PRN Vistaril requested and will re-eval accordingly.   Safety maintained with Q 15 checks. Continues to follow treatment plan and will monitor closely. No additonal questions/concerns noted.

## 2016-07-05 MED ORDER — HYDROXYZINE HCL 50 MG PO TABS
50.0000 mg | ORAL_TABLET | Freq: Four times a day (QID) | ORAL | Status: DC | PRN
  Administered 2016-07-05 – 2016-07-06 (×2): 50 mg via ORAL
  Filled 2016-07-05 (×2): qty 1
  Filled 2016-07-05: qty 10

## 2016-07-05 NOTE — [facility] Group Notes (Signed)
[facility] LCSW Group Therapy  07/05/2016 12:59 PM  Type of Therapy:  Group Therapy  Participation Level:  Active  Participation Quality:  Attentive  Affect:  Tearful  Cognitive:  Alert and Oriented  Insight:  Improving  Engagement in Therapy:  Improving  Modes of Intervention:  Confrontation, Discussion, Education, Problem-solving, Socialization and Support  Summary of Progress/Problems: Today's Topic: Overcoming Obstacles. Patients identified one short term goal and potential obstacles in reaching this goal. Patients processed barriers involved in overcoming these obstacles. Patients identified steps necessary for overcoming these obstacles and explored motivation (internal and external) for facing these difficulties head on.   Kristan Votta N Smart LCSW 07/05/2016, 12:59 PM

## 2016-07-05 NOTE — Progress Notes (Signed)
The patient attended this evening's A.A.meeting and was appropriate.  

## 2016-07-05 NOTE — Tx Team (Signed)
Interdisciplinary Treatment and Diagnostic Plan Update  07/05/2016 Time of Session: 0900am Christopher Reid MRN: [id number]  Principal Diagnosis: Alcohol use disorder, severe, dependence (HCC)  Secondary Diagnoses: Principal Problem:   Alcohol use disorder, severe, dependence (HCC) Active Problems:   Major depressive disorder, single episode, severe without psychosis (HCC)   Current Medications:  Current Facility-Administered Medications  Medication Dose Route Frequency Provider Last Rate Last Dose  . alum & mag hydroxide-simeth (MAALOX/MYLANTA) 200-200-20 MG/5ML suspension 30 mL  30 mL Oral Q4H PRN Charm Rings, NP      . ibuprofen (ADVIL,MOTRIN) tablet 600 mg  600 mg Oral Q6H PRN Kerry Hough, PA-C   600 mg at 07/04/16 1720  . [START ON 07/06/2016] LORazepam (ATIVAN) tablet 1 mg  1 mg Oral Daily Vincent A Izediuno, MD      . magnesium hydroxide (MILK OF MAGNESIA) suspension 30 mL  30 mL Oral Daily PRN Charm Rings, NP      . multivitamin with minerals tablet 1 tablet  1 tablet Oral Daily Adonis Brook, NP   1 tablet at 07/05/16 0844  . naltrexone (DEPADE) tablet 50 mg  50 mg Oral Daily Georgiann Cocker, MD   50 mg at 07/05/16 0844  . nicotine (NICODERM CQ - dosed in mg/24 hours) patch 21 mg  21 mg Transdermal Daily Adonis Brook, NP   21 mg at 07/05/16 0849  . thiamine (VITAMIN B-1) tablet 100 mg  100 mg Oral Daily Adonis Brook, NP   100 mg at 07/05/16 0844  . traZODone (DESYREL) tablet 50 mg  50 mg Oral QHS,MR X 1 Kerry Hough, PA-C   50 mg at 07/04/16 2228   PTA Medications: Prescriptions Prior to Admission  Medication Sig Dispense Refill Last Dose  . chlordiazePOXIDE (LIBRIUM) 25 MG capsule 50mg  PO TID x 1D, then 25-50mg  PO BID X 1D, then 25-50mg  PO QD X 1D (Patient not taking: Reported on 06/30/2016) 10 capsule 0 Not Taking at Unknown time  . diphenhydramine-acetaminophen (TYLENOL PM) 25-500 MG TABS tablet Take 1 tablet by mouth at bedtime as needed.   Past Month  at Unknown time  . nicotine (NICODERM CQ - DOSED IN MG/24 HOURS) 21 mg/24hr patch Place 1 patch (21 mg total) onto the skin daily. (Patient not taking: Reported on 05/05/2016) 28 patch 0 Not Taking at Unknown time  . thiamine 100 MG tablet Take 1 tablet (100 mg total) by mouth daily. (Patient not taking: Reported on 05/05/2016) 30 tablet 0 Not Taking at Unknown time    Patient Stressors: Substance abuse  Patient Strengths: Average or above average intelligence Capable of independent living Communication skills  Treatment Modalities: Medication Management, Group therapy, Case management,  1 to 1 session with clinician, Psychoeducation, Recreational therapy.   Physician Treatment Plan for Primary Diagnosis: Alcohol use disorder, severe, dependence (HCC) Long Term Goal(s): Improvement in symptoms so as ready for discharge Improvement in symptoms so as ready for discharge   Short Term Goals: Ability to identify triggers associated with substance abuse/mental health issues will improve Ability to verbalize feelings will improve Ability to disclose and discuss suicidal ideas Ability to demonstrate self-control will improve Ability to identify and develop effective coping behaviors will improve  Medication Management: Evaluate patient's response, side effects, and tolerance of medication regimen.  Therapeutic Interventions: 1 to 1 sessions, Unit Group sessions and Medication administration.  Evaluation of Outcomes: Progressing  Physician Treatment Plan for Secondary Diagnosis: Principal Problem:   Alcohol use disorder, severe, dependence (  HCC) Active Problems:   Major depressive disorder, single episode, severe without psychosis (HCC)  Long Term Goal(s): Improvement in symptoms so as ready for discharge Improvement in symptoms so as ready for discharge   Short Term Goals: Ability to identify triggers associated with substance abuse/mental health issues will improve Ability to  verbalize feelings will improve Ability to disclose and discuss suicidal ideas Ability to demonstrate self-control will improve Ability to identify and develop effective coping behaviors will improve     Medication Management: Evaluate patient's response, side effects, and tolerance of medication regimen.  Therapeutic Interventions: 1 to 1 sessions, Unit Group sessions and Medication administration.  Evaluation of Outcomes: Progressing   RN Treatment Plan for Primary Diagnosis: Alcohol use disorder, severe, dependence (HCC) Long Term Goal(s): Knowledge of disease and therapeutic regimen to maintain health will improve  Short Term Goals: Ability to remain free from injury will improve and Ability to verbalize frustration and anger appropriately will improve  Medication Management: RN will administer medications as ordered by provider, will assess and evaluate patient's response and provide education to patient for prescribed medication. RN will report any adverse and/or side effects to prescribing provider.  Therapeutic Interventions: 1 on 1 counseling sessions, Psychoeducation, Medication administration, Evaluate responses to treatment, Monitor vital signs and CBGs as ordered, Perform/monitor CIWA, COWS, AIMS and Fall Risk screenings as ordered, Perform wound care treatments as ordered.  Evaluation of Outcomes: Progressing   LCSW Treatment Plan for Primary Diagnosis: Alcohol use disorder, severe, dependence (HCC) Long Term Goal(s): Safe transition to appropriate next level of care at discharge, Engage patient in therapeutic group addressing interpersonal concerns.  Short Term Goals: Engage patient in aftercare planning with referrals and resources  Therapeutic Interventions: Assess for all discharge needs, 1 to 1 time with Social worker, Explore available resources and support systems, Assess for adequacy in community support network, Educate family and significant other(s) on suicide  prevention, Complete Psychosocial Assessment, Interpersonal group therapy.  Evaluation of Outcomes: Progressing   Progress in Treatment: Attending groups: Yes. Participating in groups: Yes. Taking medication as prescribed: Yes. Toleration medication: Yes. Family/Significant other contact made: SPE completed with stepmother.  Patient understands diagnosis: Yes. Discussing patient identified problems/goals with staff: Yes. Medical problems stabilized or resolved: Yes. Denies suicidal/homicidal ideation: Yes, self report.  Issues/concerns per patient self-inventory: Yes. Other:   New problem(s) identified: No, Describe:  none reported  New Short Term/Long Term Goal(s): detox; medication stabilization; development of comprehensive mental wellness/sobriety plan.   Discharge Plan or Barriers:  [facility] fax[facility] after speaking with pt's stepmother to verify insurance.   Reason for Continuation of Hospitalization: Depression Medication stabilization  Estimated Length of Stay: 3-4 days   Attendees: Patient: 07/05/2016 12:27 PM  Physician: Dr. Jackquline BerlinIzediuno MD 07/05/2016 12:27 PM  Nursing: Meriam SpragueBeverly, RN; [facility] 07/05/2016 12:27 PM  RN Care Manager:  Victorino DikeJennifer, RN 07/05/2016 12:27 PM  Social Worker: Trula SladeHeather Smart, LCSW; Vernie ShanksLauren Newton LCSW 07/05/2016 12:27 PM  Recreational Therapist: Juliann ParesX 07/05/2016 12:27 PM  Other: Armandina StammerAgnes Nwoko NP; Hillery Jacksanika Lewis NP 07/05/2016 12:27 PM  Other:  07/05/2016 12:27 PM  Other: 07/05/2016 12:27 PM    Scribe for Treatment Team: Ledell PeoplesHeather N Smart, LCSW 07/05/2016 12:27 PM

## 2016-07-05 NOTE — Progress Notes (Signed)
[facility] MD Progress Note  07/05/2016 3:58 PM TEJAS SEAWOOD  MRN:  [id number] Subjective:   36 yo Caucasian male, single, lives alone, unemployed. Background history of Alcohol Use Disorder. Presented to the ER in company of his family. Intoxicated at presentation with BAL 423 mg/dl. Admitted for alcohol detox.  Seen today. Feeling much better. Shakes are less. He is still taking 2 mg Ativan. Not complaining of headaches or anxiety. No GI symptoms. Patient is still committed to inpatient residential treatment. He wants to get home first before checking in. No features of depression. No features of anxiety. No features of mania.   SW reports a bed might be available on Monday.  Nursing staff reports that he has been interacting appropriately. He is grooming self well. No behavioral issues. He has been participating at groups.   Principal Problem: Alcohol Use Disorder Diagnosis:   Patient Active Problem List   Diagnosis Date Noted  . Major depressive disorder, recurrent severe without psychotic features (HCC) [F33.2] 07/01/2016  . Major depressive disorder, single episode, severe without psychosis (HCC) [F32.2] 07/01/2016  . Acute alcoholic hepatitis [K70.10] 09/18/2015  . Substance induced mood disorder (HCC) [F19.94] 09/18/2015  . Chronic post-traumatic stress disorder (PTSD) [F43.12] 09/18/2015  . Alcohol use disorder, severe, dependence (HCC) [F10.20] 09/17/2015  . Alcohol-induced mood disorder (HCC) [F10.94] 09/09/2015  . Alcohol dependence with uncomplicated withdrawal (HCC) [F10.230] 09/09/2015  . Alcohol withdrawal (HCC) [F10.239] 09/09/2015  . Tobacco abuse [Z72.0] 09/09/2015  . Depression [F32.9] 09/09/2015  . Elevated LFTs [R79.89] 09/09/2015  . Hypertension [I10] 09/09/2015  . Panic attack [F41.0] 03/16/2015  . Alcohol abuse [F10.10] 03/16/2015   Total Time spent with patient: 20 minutes  Past Psychiatric History: No past history of mental illness. Has never been on  psychotropic medication. No past history of suicide. No past history of violent behavior.   Past Medical History:  Past Medical History:  Diagnosis Date  . Alcohol abuse   . Anxiety   . Depression   . Insomnia    History reviewed. No pertinent surgical history. Family History:  Family History  Problem Relation Age of Onset  . Alcohol abuse Paternal Uncle   . Alcohol abuse Maternal Grandfather   . Depression Mother   . Alcohol abuse Maternal Uncle   . Alcohol abuse Paternal Grandfather    Family Psychiatric  History: Family history of addiction. No family history of mental illness or suicide.  Social History:  History  Alcohol Use  . 48.0 oz/week  . 80 Shots of liquor per week    Comment: heavy     History  Drug Use No    Social History   Social History  . Marital status: Single    Spouse name: N/A  . Number of children: N/A  . Years of education: N/A   Social History Main Topics  . Smoking status: Current Every Day Smoker    Packs/day: 0.50  . Smokeless tobacco: Never Used  . Alcohol use 48.0 oz/week    80 Shots of liquor per week     Comment: heavy  . Drug use: No  . Sexual activity: Not Currently   Other Topics Concern  . None   Social History Narrative  . None   Additional Social History:    Withdrawal Symptoms: Anorexia, Nausea / Vomiting, Sweats, Cramps, Tingling, Tremors, Agitation    Sleep: Good  Appetite: Good  Current Medications: Current Facility-Administered Medications  Medication Dose Route Frequency Provider Last Rate Last Dose  .  alum & mag hydroxide-simeth (MAALOX/MYLANTA) 200-200-20 MG/5ML suspension 30 mL  30 mL Oral Q4H PRN Charm RingsJamison Y Lord, NP      . ibuprofen (ADVIL,MOTRIN) tablet 600 mg  600 mg Oral Q6H PRN Kerry HoughSpencer E Simon, PA-C   600 mg at 07/04/16 1720  . [START ON 07/06/2016] LORazepam (ATIVAN) tablet 1 mg  1 mg Oral Daily Vincent A Izediuno, MD      . magnesium hydroxide (MILK OF MAGNESIA) suspension 30 mL  30 mL Oral Daily  PRN Charm RingsJamison Y Lord, NP      . multivitamin with minerals tablet 1 tablet  1 tablet Oral Daily Adonis BrookSheila Agustin, NP   1 tablet at 07/05/16 0844  . naltrexone (DEPADE) tablet 50 mg  50 mg Oral Daily Georgiann CockerVincent A Izediuno, MD   50 mg at 07/05/16 0844  . nicotine (NICODERM CQ - dosed in mg/24 hours) patch 21 mg  21 mg Transdermal Daily Adonis BrookSheila Agustin, NP   21 mg at 07/05/16 0849  . thiamine (VITAMIN B-1) tablet 100 mg  100 mg Oral Daily Adonis BrookSheila Agustin, NP   100 mg at 07/05/16 0844  . traZODone (DESYREL) tablet 50 mg  50 mg Oral QHS,MR X 1 Kerry HoughSpencer E Simon, PA-C   50 mg at 07/04/16 2228    Lab Results: No results found for this or any previous visit (from the past 48 hour(s)).  Blood Alcohol level:  Lab Results  Component Value Date   ETH 423 (HH) 06/30/2016   ETH 421 (HH) 05/05/2016    Metabolic Disorder Labs: No results found for: HGBA1C, MPG No results found for: PROLACTIN No results found for: CHOL, TRIG, HDL, CHOLHDL, VLDL, LDLCALC  Physical Findings: AIMS:  , ,  ,  ,    CIWA:  CIWA-Ar Total: 0 COWS:     Musculoskeletal: Strength & Muscle Tone: within normal limits Gait & Station: normal Patient leans: N/A  Psychiatric Specialty Exam: Physical Exam  Constitutional: He is oriented to person, place, and time. He appears well-developed and well-nourished.  HENT:  Head: Normocephalic and atraumatic.  Eyes: Conjunctivae and EOM are normal. Pupils are equal, round, and reactive to light.  Neck: Normal range of motion. Neck supple.  Cardiovascular:  tachycardia  Respiratory: Effort normal and breath sounds normal.  GI: Bowel sounds are normal.  Musculoskeletal: Normal range of motion.  Neurological: He is alert and oriented to person, place, and time.  Skin:  sweaty  Psychiatric:  As above    ROS  Blood pressure 135/87, pulse 93, temperature 97.9 F (36.6 C), temperature source Oral, resp. rate 18, height 6\' 4"  (1.93 m), weight 98.9 kg (218 lb), SpO2 98 %.Body mass index is  26.54 kg/m.  General Appearance: Neatly dressed. Very minimal tremors. Appropriate behavior.    Eye Contact:  Good  Speech:  Clear and Coherent and Normal Rate  Volume:  Normal  Mood:  Euthymic  Affect:  Appropriate and Full Range  Thought Process:  Goal Directed  Orientation:  Full (Time, Place, and Person)  Thought Content:  Focused on recovery. No thoughts of violence. No hallucination in any modality. No delusional theme.   Suicidal Thoughts:  No  Homicidal Thoughts:  No  Memory:  Immediate;   Good Recent;   Good Remote;   Good  Judgement:  Good  Insight:  Good  Psychomotor Activity:  Normal  Concentration:  Concentration: Good and Attention Span: Good  Recall:  Good  Fund of Knowledge:  Good  Language:  Good  Akathisia:  No  Handed:    AIMS (if indicated):     Assets:  Communication Skills Desire for Improvement Financial Resources/Insurance Housing Resilience  ADL's:  Impaired  Cognition:  WNL  Sleep:  Number of Hours: 4.5     Treatment Plan Summary:  Withdrawal symptoms are less. We plan to taper Ativan today. He is not psychotic. He is not a danger to himself or others   Plan: 1. Taper Ativan to 1 mg  2. Continue to monitor mood, behavior and interaction with peers 3. Continue to encourage groups and unit activities    Georgiann Cocker, MD 07/05/2016, 3:58 PMPatient ID: Adair Patter, male   DOB: 28-Mar-1981, 36 y.o.   MRN: [id number] Patient ID: NICOLE HAFLEY, male   DOB: Jan 24, 1981, 36 y.o.   MRN: [id number]

## 2016-07-05 NOTE — Progress Notes (Signed)
Patient ID: Adair PatterWilliam C Arango, male   DOB: [birth year], 36 y.o.   MRN: [id number]  Pt currently presents with a flat affect and ambivalent behavior. Pt reports to writer that their goal is to "go home." Pt states "I am doing fine." Pt reports good sleep with current medication regimen. Pt interacts with peers in the dayroom today. Does not report withdrawal symptoms but patient exhibits tremors, a runny nose and general aches.   Pt provided with medications per providers orders. Pt's labs and vitals were monitored throughout the night. Pt given a 1:1 about emotional and mental status. Pt supported and encouraged to express concerns and questions. Pt educated on medications.  Pt's safety ensured with 15 minute and environmental checks. Pt currently denies SI/HI and A/V hallucinations. Pt verbally agrees to seek staff if SI/HI or A/VH occurs and to consult with staff before acting on any harmful thoughts. Will continue POC.

## 2016-07-05 NOTE — Progress Notes (Signed)
Recreation Therapy Notes  Date: 07/05/16 Time: 0930 Location: [address] Dayroom  Group Topic: Coping Skills  Goal Area(s) Addresses:  Patients will be able to identify positive stress management techniques. Patients will be able to identify the benefits of stress management. Patients will be able to identify benefits of using stress management post d/c.  Behavioral Response: Engaged  Intervention: Stress Management  Activity: Progressive Muscle Relaxation.  LRT introduced the stress management technique of progressive muscle relaxation.  LRT read a script to lead the patients through the technique to tense and relax each muscle group individually.  Patients were to follow along as the script was read to engage in the activity,   Education: Coping Skills, Discharge Planning.   Education Outcome: Acknowledges understanding/In group clarification offered/Needs additional education.   Clinical Observations/Feedback: Pt attended group.    Caroll RancherMarjette Barbarita Hutmacher, LRT/CTRS         Caroll RancherLindsay, Taino Maertens A 07/05/2016 11:36 AM

## 2016-07-05 NOTE — Progress Notes (Signed)
  Adult Psychoeducational Group Note  Date:  07/05/2016 Time:  12:06 PM  Group Topic/Focus:  Relapse Prevention Planning:   The focus of this group is to define relapse and discuss the need for planning to combat relapse.  Participation Level:  Active  Participation Quality:  Appropriate, Attentive and Supportive  Affect:  Appropriate  Cognitive:  Appropriate  Insight: Appropriate and Good  Engagement in Group:  Developing/Improving  Modes of Intervention:  Discussion, Education, Limit-setting, Orientation, Socialization and Support  Additional Comments:  Pt attended group on relapse prevention and recognizing mental health issues and triggers. Discussion of how to overcome relapse and ways to utilize time management becoming healthier. Karleen HampshireFox, Julicia Krieger Brittini 07/05/2016, 12:06 PM

## 2016-07-05 NOTE — Progress Notes (Signed)
D: Pt A & O X4. Observed interacting well with peers and staff.  Visible in milieu majority of this shift. Denies SI, HI, AVH and pain when assessed. Presents anxious but pleasant. Superficial and cautious during conversations with others but is appropriate. Verbalized improvement in withdrawal symptoms and is evident by decrease agitation, headaches/stuffiness and tremors. Rates his depression 1/10, hopelessness 0/10 and anxiety 1/10. Reports he's sleeping and eating well with good concentration and normal energy level.  A: Emotional support and availability provided to pt.  All medications administered as prescribed. Encouraged pt to voice concerns and comply with treatment regimen.  Q 15 minutes safety checks continues on and off unit without self harm gestures to note thus far.  R: Pt cooperative with care and unit routines. Attended scheduled unit groups. Took his medications without issues. Denies adverse drug reactions at present. POC continues for safety and mood stability.

## 2016-07-05 NOTE — [facility] Suicide Risk Assessment (Signed)
[facility] INPATIENT:  Family/Significant Other Suicide Prevention Education  Suicide Prevention Education:  Education Completed; Pati GalloBarbara Haas (pt's stepmother) [phone] has been identified by the patient as the family member/significant other with whom the patient will be residing, and identified as the person(s) who will aid the patient in the event of a mental health crisis (suicidal ideations/suicide attempt).  With written consent from the patient, the family member/significant other has been provided the following suicide prevention education, prior to the and/or following the discharge of the patient.  The suicide prevention education provided includes the following:  Suicide risk factors  Suicide prevention and interventions  National Suicide Hotline telephone number  [facility] assessment telephone number  [facility] Assistance 911  [facility] and/or Residential Mobile Crisis Unit telephone number  Request made of family/significant other to:  Remove weapons (e.g., guns, rifles, knives), all items previously/currently identified as safety concern.    Remove drugs/medications (over-the-counter, prescriptions, illicit drugs), all items previously/currently identified as a safety concern.  The family member/significant other verbalizes understanding of the suicide prevention education information provided.  The family member/significant other agrees to remove the items of safety concern listed above.  Kimble Delaurentis N Smart LCSW 07/05/2016, 12:27 PM

## 2016-07-06 NOTE — Progress Notes (Signed)
Pt attended AA group this evening.  

## 2016-07-06 NOTE — [facility] Group Notes (Signed)
[facility] LCSW Group Therapy Note  07/06/2016 at 10:10 to 11:05 AM  Type of Therapy and Topic:  Group Therapy: Motivation   Participation Level:  Active  Participation Quality:  Attentive and Sharing  Affect:  Depressed  Cognitive:  Appropriate  Insight:  Developing/Improving  Engagement in Therapy:  Engaged   Therapeutic models used Cognitive Behavioral Therapy Person-Centered Therapy Motivational Interviewing   Summary of Patient Progress: The main focus of today's process group was to discuss patient motivation to change. Motivational Interviewing was used to initiate discussion with group and individuals were allowed to process to their resistance. Patient shared both internal (fear of increased losses) and external (family) motivation to attain sobriety. Patient processed how his tendency to isolate has worked against him in the past.   Carney Bernatherine C Deral Schellenberg, LCSW

## 2016-07-06 NOTE — [facility] Group Notes (Signed)
[facility] Group Notes:  (Nursing/MHT/Case Management/Adjunct)  Date:  07/06/2016  Time:  4:11 PM  Type of Therapy:  Nurse Education  Participation Level:  Active  Participation Quality:  Appropriate and Attentive  Affect:  Blunted  Cognitive:  Alert and Oriented  Insight:  Improving  Engagement in Group:  Developing/Improving  Modes of Intervention:  Discussion and Support  Summary of Progress/Problems: Patient attended and participated in group, talking about distressing situations and drinking to dull unpleasant feelings.  Vinetta BergamoBarbara M Burdell Peed 07/06/2016, 4:11 PM

## 2016-07-06 NOTE — Progress Notes (Signed)
Patient ID: Christopher Reid, male   DOB: 12-05-1980, 36 y.o.   MRN: [id number]  Pt currently presents with a blunted affect and guarded behavior. Pt facial expressions are not congruent with thought. Pt reports to writer that today was a "good day." Pt states "the vistaril does help (with his anxiety)." Pt reports good sleep with current medication regimen. Remains in the dayroom, positive interactions with peers.   Pt provided with medications per providers orders. Pt's labs and vitals were monitored throughout the night. Pt given a 1:1 about emotional and mental status. Pt supported and encouraged to express concerns and questions. Pt educated on medications.  Pt's safety ensured with 15 minute and environmental checks. Pt currently denies SI/HI and A/V hallucinations. Pt verbally agrees to seek staff if SI/HI or A/VH occurs and to consult with staff before acting on any harmful thoughts. Pt reports that he is looking forward to going to rehab on Monday. Will continue POC.

## 2016-07-06 NOTE — Progress Notes (Signed)
[facility] MD Progress Note  07/06/2016 3:28 PM Christopher Reid  MRN:  [id number] Subjective:   36 yo Caucasian male, single, lives alone, unemployed. Background history of Alcohol Use Disorder. Presented to the ER in company of his family. Intoxicated at presentation with BAL 423 mg/dl. Admitted for alcohol detox.  Seen today. Doing well. Has not required benzodiazepines as his CIWA scores has been good. No internal restlessness. No physiological or psychological features of withdrawals. He has been accepted into rehab for Monday. No thoughts of suicide. No evidence of depression. No evidence of mania. No evidence of psychosis. Nursing staff reports that patient has been appropriate on the unit. Patient has been interacting well with peers. No behavioral issues. Patient has not voiced any suicidal thoughts. Patient has not been observed to be internally stimulated. Patient has been adherent with treatment recommendations. Patient has been tolerating their medication well.   Principal Problem: Alcohol Use Disorder Diagnosis:   Patient Active Problem List   Diagnosis Date Noted  . Major depressive disorder, recurrent severe without psychotic features (HCC) [F33.2] 07/01/2016  . Major depressive disorder, single episode, severe without psychosis (HCC) [F32.2] 07/01/2016  . Acute alcoholic hepatitis [K70.10] 09/18/2015  . Substance induced mood disorder (HCC) [F19.94] 09/18/2015  . Chronic post-traumatic stress disorder (PTSD) [F43.12] 09/18/2015  . Alcohol use disorder, severe, dependence (HCC) [F10.20] 09/17/2015  . Alcohol-induced mood disorder (HCC) [F10.94] 09/09/2015  . Alcohol dependence with uncomplicated withdrawal (HCC) [F10.230] 09/09/2015  . Alcohol withdrawal (HCC) [F10.239] 09/09/2015  . Tobacco abuse [Z72.0] 09/09/2015  . Depression [F32.9] 09/09/2015  . Elevated LFTs [R79.89] 09/09/2015  . Hypertension [I10] 09/09/2015  . Panic attack [F41.0] 03/16/2015  . Alcohol abuse [F10.10]  03/16/2015   Total Time spent with patient: 20 minutes  Past Psychiatric History: No past history of mental illness. Has never been on psychotropic medication. No past history of suicide. No past history of violent behavior.   Past Medical History:  Past Medical History:  Diagnosis Date  . Alcohol abuse   . Anxiety   . Depression   . Insomnia    History reviewed. No pertinent surgical history. Family History:  Family History  Problem Relation Age of Onset  . Alcohol abuse Paternal Uncle   . Alcohol abuse Maternal Grandfather   . Depression Mother   . Alcohol abuse Maternal Uncle   . Alcohol abuse Paternal Grandfather    Family Psychiatric  History: Family history of addiction. No family history of mental illness or suicide.  Social History:  History  Alcohol Use  . 48.0 oz/week  . 80 Shots of liquor per week    Comment: heavy     History  Drug Use No    Social History   Social History  . Marital status: Single    Spouse name: N/A  . Number of children: N/A  . Years of education: N/A   Social History Main Topics  . Smoking status: Current Every Day Smoker    Packs/day: 0.50  . Smokeless tobacco: Never Used  . Alcohol use 48.0 oz/week    80 Shots of liquor per week     Comment: heavy  . Drug use: No  . Sexual activity: Not Currently   Other Topics Concern  . None   Social History Narrative  . None   Additional Social History:    Withdrawal Symptoms: Anorexia, Nausea / Vomiting, Sweats, Cramps, Tingling, Tremors, Agitation    Sleep: Good  Appetite: Good  Current Medications: Current Facility-Administered Medications  Medication Dose Route Frequency Provider Last Rate Last Dose  . alum & mag hydroxide-simeth (MAALOX/MYLANTA) 200-200-20 MG/5ML suspension 30 mL  30 mL Oral Q4H PRN Charm Rings, NP      . hydrOXYzine (ATARAX/VISTARIL) tablet 50 mg  50 mg Oral Q6H PRN Laveda Abbe, NP   50 mg at 07/05/16 1811  . ibuprofen (ADVIL,MOTRIN) tablet  600 mg  600 mg Oral Q6H PRN Kerry Hough, PA-C   600 mg at 07/05/16 1711  . magnesium hydroxide (MILK OF MAGNESIA) suspension 30 mL  30 mL Oral Daily PRN Charm Rings, NP      . multivitamin with minerals tablet 1 tablet  1 tablet Oral Daily Adonis Brook, NP   1 tablet at 07/06/16 1610  . naltrexone (DEPADE) tablet 50 mg  50 mg Oral Daily Georgiann Cocker, MD   50 mg at 07/06/16 9604  . nicotine (NICODERM CQ - dosed in mg/24 hours) patch 21 mg  21 mg Transdermal Daily Adonis Brook, NP   21 mg at 07/06/16 5409  . thiamine (VITAMIN B-1) tablet 100 mg  100 mg Oral Daily Adonis Brook, NP   100 mg at 07/06/16 8119  . traZODone (DESYREL) tablet 50 mg  50 mg Oral QHS,MR X 1 Kerry Hough, PA-C   50 mg at 07/05/16 2238    Lab Results: No results found for this or any previous visit (from the past 48 hour(s)).  Blood Alcohol level:  Lab Results  Component Value Date   ETH 423 (HH) 06/30/2016   ETH 421 (HH) 05/05/2016    Metabolic Disorder Labs: No results found for: HGBA1C, MPG No results found for: PROLACTIN No results found for: CHOL, TRIG, HDL, CHOLHDL, VLDL, LDLCALC  Physical Findings: AIMS: Facial and Oral Movements Muscles of Facial Expression: None, normal Lips and Perioral Area: None, normal Jaw: None, normal Tongue: None, normal,Extremity Movements Upper (arms, wrists, hands, fingers): None, normal Lower (legs, knees, ankles, toes): None, normal, Trunk Movements Neck, shoulders, hips: None, normal, Overall Severity Severity of abnormal movements (highest score from questions above): None, normal Incapacitation due to abnormal movements: None, normal Patient's awareness of abnormal movements (rate only patient's report): No Awareness, Dental Status Current problems with teeth and/or dentures?: No Does patient usually wear dentures?: No  CIWA:  CIWA-Ar Total: 0 COWS:     Musculoskeletal: Strength & Muscle Tone: within normal limits Gait & Station:  normal Patient leans: N/A  Psychiatric Specialty Exam: Physical Exam  Constitutional: He is oriented to person, place, and time. He appears well-developed and well-nourished.  HENT:  Head: Normocephalic and atraumatic.  Eyes: Conjunctivae and EOM are normal. Pupils are equal, round, and reactive to light.  Neck: Normal range of motion. Neck supple.  Cardiovascular:  tachycardia  Respiratory: Effort normal and breath sounds normal.  GI: Bowel sounds are normal.  Musculoskeletal: Normal range of motion.  Neurological: He is alert and oriented to person, place, and time.  Skin:  sweaty  Psychiatric:  As above    ROS  Blood pressure (!) 125/96, pulse 80, temperature 97.9 F (36.6 C), resp. rate 16, height 6\' 4"  (1.93 m), weight 98.9 kg (218 lb), SpO2 98 %.Body mass index is 26.54 kg/m.  General Appearance: Neatly dressed, pleasant, engaging well and cooperative. Appropriate behavior. Not in any distress. Good relatedness. Not internally stimulated  Eye Contact:  Good  Speech:  Spontaneous, normal prosody. Normal tone and rate.   Volume:  Normal  Mood:  Euthymic  Affect:  Appropriate and Full Range  Thought Process:  Goal Directed  Orientation:  Full (Time, Place, and Person)  Thought Content:  Focused on recovery. No thoughts of violence. No hallucination in any modality. No delusional theme.   Suicidal Thoughts:  No  Homicidal Thoughts:  No  Memory:  Immediate;   Good Recent;   Good Remote;   Good  Judgement:  Good  Insight:  Good  Psychomotor Activity:  Normal  Concentration:  Concentration: Good and Attention Span: Good  Recall:  Good  Fund of Knowledge:  Good  Language:  Good  Akathisia:  No  Handed:    AIMS (if indicated):     Assets:  Communication Skills Desire for Improvement Financial Resources/Insurance Housing Resilience  ADL's:  Impaired  Cognition:  WNL  Sleep:  Number of Hours: 5.25     Treatment Plan Summary:  Patient has completely come off  alcohol. He is motivated to get into residential treatment. He is not a danger to himself or others.   Plan: 1. For discharge on Monday 2. Continue to monitor mood, behavior and interaction with peers 3. Continue to encourage groups and unit activities    Georgiann CockerVincent A Enedina Pair, MD 07/06/2016, 3:28 PMPatient ID: Christopher Reid, male   DOB: 1980/09/22, 36 y.o.   MRN: [id number] Patient ID: Christopher Reid, male   DOB: 1980/09/22, 36 y.o.   MRN: [id number] Patient ID: Christopher Reid, male   DOB: 1980/09/22, 36 y.o.   MRN: [id number]

## 2016-07-06 NOTE — Progress Notes (Signed)
D: Patient's self inventory sheet: patient has fair sleep, recieved sleep medication and stated that he thinks it's contributing to nightmares.good  Appetite, normal energy level, good concentration. Rated depression 1/10, hopeless 1/10, anxiety 1/10. SI/HI/AVH: denies all. Physical complaints are denied. Goal is "one day at a time". Plans to work on "stay calm, don't worry".   A: Medications administered, assessed medication knowledge and education given on medication regimen.  Emotional support and encouragement given patient. R: Denies SI and HI , contracts for safety. Safety maintained with 15 minute checks.

## 2016-07-07 MED ORDER — TRAZODONE HCL 50 MG PO TABS: 50.0000 mg | ORAL_TABLET | Freq: Every evening | ORAL | 0 refills | Status: AC | PRN

## 2016-07-07 MED ORDER — NALTREXONE HCL 50 MG PO TABS: 50.0000 mg | ORAL_TABLET | Freq: Every day | ORAL | 0 refills | Status: DC

## 2016-07-07 MED ORDER — HYDROXYZINE HCL 50 MG PO TABS: 50.0000 mg | ORAL_TABLET | Freq: Four times a day (QID) | ORAL | 0 refills | Status: AC | PRN

## 2016-07-07 MED ORDER — NALTREXONE HCL 50 MG PO TABS: 50.0000 mg | ORAL_TABLET | Freq: Every day | ORAL | 0 refills | Status: AC

## 2016-07-07 MED ORDER — NICOTINE 21 MG/24HR TD PT24: 21.0000 mg | MEDICATED_PATCH | Freq: Every day | TRANSDERMAL | 0 refills | Status: AC

## 2016-07-07 NOTE — Progress Notes (Signed)
The patient attended this evening's A.A.meeting and was appropriate.  

## 2016-07-07 NOTE — Progress Notes (Signed)
D: Patient's self inventory sheet: patient has fair sleep, recieved sleep medication.good  Appetite, normal energy level, good concentration. Rated depression 1/10, hopeless 1/10, anxiety 2/10. SI/HI/AVH: denies all. Physical complaints are denied. Goal is "preparing for rehab". Plans to work on "take notes".   A: Medications administered, assessed medication knowledge and education given on medication regimen.  Emotional support and encouragement given patient. R: Denies SI and HI , contracts for safety. Safety maintained with 15 minute checks.

## 2016-07-07 NOTE — Progress Notes (Signed)
Patient ID: Christopher Reid, male   DOB: [birth year], 36 y.o.   MRN: [id number]   Pt currently presents with pleasant behavior. Pt reports to Clinical research associatewriter that [facility] Pt reports good sleep with current medication regimen.   Pt provided with medications per providers orders. Pt's labs and vitals were monitored throughout the night. Pt given a 1:1 about emotional and mental status. Pt supported and encouraged to express concerns and questions. Pt educated on medications.  Pt's safety ensured with 15 minute and environmental checks. Pt currently denies SI/HI and A/V hallucinations. Pt verbally agrees to seek staff if SI/HI or A/VH occurs and to consult with staff before acting on any harmful thoughts. Reviewed SRA and AVS with patient, placed on physical chart. Will continue POC.

## 2016-07-07 NOTE — Discharge Summary (Signed)
Physician Discharge Summary Note  Patient:  Christopher Reid is an 36 y.o., male MRN:  [id number] DOB:  11-23-1980 Patient phone:  [phone] (home)  Patient address:   [address] [place] [id number],  Total Time spent with patient: 20 minutes  Date of Admission:  07/01/2016 Date of Discharge:  07/07/2016  Reason for Admission:PER H&P- Reports he has history of alcohol dependence, and after a period of 7 months of sobriety relapsed last October. Has been drinking daily, drinks about 6 beers and 6 mini bottles of liquor per day. Last alcohol consumption 48 hours ago.  States that he has had recent blackouts . Recently his stepmother came to visit him from [place] as family has been aware he is not doing well and found him severely intoxicated, so called 911. Admission BAL 423. Of note, admission UDS negative .Denies severe depression , but states he has been isolating , withdrawn, feeling " lonely". Patient reports psychosocial stressors. Broke up several months ago. 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   Principal Problem: Alcohol use disorder, severe, dependence [facility]HCC) Discharge Diagnoses: Patient Active Problem List   Diagnosis Date Noted  . Major depressive disorder, recurrent severe without psychotic features (HCC) [F33.2] 07/01/2016  . Major depressive disorder, single episode, severe without psychosis (HCC) [F32.2] 07/01/2016  . Acute alcoholic hepatitis [K70.10] 09/18/2015  . Substance induced mood disorder (HCC) [F19.94] 09/18/2015  . Chronic post-traumatic stress disorder (PTSD) [F43.12] 09/18/2015  . Alcohol use disorder, severe, dependence (HCC) [F10.20] 09/17/2015  . Alcohol-induced mood disorder (HCC) [F10.94] 09/09/2015  . Alcohol dependence with uncomplicated withdrawal (HCC) [F10.230] 09/09/2015  . Alcohol withdrawal (HCC) [F10.239] 09/09/2015  . Tobacco  abuse [Z72.0] 09/09/2015  . Depression [F32.9] 09/09/2015  . Elevated LFTs [R79.89] 09/09/2015  . Hypertension [I10] 09/09/2015  . Panic attack [F41.0] 03/16/2015  . Alcohol abuse [F10.10] 03/16/2015    Past Psychiatric History:   Past Medical History:  Past Medical History:  Diagnosis Date  . Alcohol abuse   . Anxiety   . Depression   . Insomnia    History reviewed. No pertinent surgical history. Family History:  Family History  Problem Relation Age of Onset  . Alcohol abuse Paternal Uncle   . Alcohol abuse Maternal Grandfather   . Depression Mother   . Alcohol abuse Maternal Uncle   . Alcohol abuse Paternal Grandfather    Family Psychiatric  History:  Social History:  History  Alcohol Use  . 48.0 oz/week  . 80 Shots of liquor per week    Comment: heavy     History  Drug Use No    Social History   Social History  . Marital status: Single    Spouse name: N/A  . Number of children: N/A  . Years of education: N/A   Social History Main Topics  . Smoking status: Current Every Day Smoker    Packs/day: 0.50  . Smokeless tobacco: Never Used  . Alcohol use 48.0 oz/week    80 Shots of liquor per week     Comment: heavy  . Drug use: No  . Sexual activity: Not Currently   Other Topics Concern  . None   Social History Narrative  . None    Hospital Course:  Christopher Reid was admitted for Alcohol use disorder, severe, dependence (HCC) and crisis management.  Pt was treated discharged with the medications listed below under Medication List.  Medical problems were identified and treated as needed.  Home medications were restarted as appropriate.  Improvement was monitored by observation and Christopher Patter 's daily report of symptom reduction.  Emotional and mental status was monitored by daily self-inventory reports completed by Christopher Patter and clinical staff.         Christopher Patter was evaluated by the treatment team for stability and  plans for continued recovery upon discharge. Christopher Patter 's motivation was an integral factor for scheduling further treatment. Employment, transportation, bed availability, health status, family support, and any pending legal issues were also considered during hospital stay. Pt was offered further treatment options upon discharge including but not limited to Residential, Intensive Outpatient, and Outpatient treatment.  Christopher Patter will follow up with the services as listed below under Follow Up Information.     Upon completion of this admission the patient was both mentally and medically stable for discharge denying suicidal/homicidal ideation, auditory/visual/tactile hallucinations, delusional thoughts and paranoia.    Christopher Patter responded well to treatment with Trazodone 50mg   and Depade 50mg  without adverse effects.Pt demonstrated improvement without reported or observed adverse effects to the point of stability appropriate for outpatient management. Pertinent labs include: CMP, CMP for which outpatient follow-up is necessary for lab recheck as mentioned below. Reviewed CBC, CMP, BAL+423, and UDS; all unremarkable aside from noted exceptions.   Physical Findings: AIMS: Facial and Oral Movements Muscles of Facial Expression: None, normal Lips and Perioral Area: None, normal Jaw: None, normal Tongue: None, normal,Extremity Movements Upper (arms, wrists, hands, fingers): None, normal Lower (legs, knees, ankles, toes): None, normal, Trunk Movements Neck, shoulders, hips: None, normal, Overall Severity Severity of abnormal movements (highest score from questions above): None, normal Incapacitation due to abnormal movements: None, normal Patient's awareness of abnormal movements (rate only patient's report): No Awareness, Dental Status Current problems with teeth and/or dentures?: No Does patient usually wear dentures?: No  CIWA:  CIWA-Ar Total: 0 COWS:      Musculoskeletal: Strength & Muscle Tone: within normal limits Gait & Station: normal Patient leans: N/A  Psychiatric Specialty Exam: See SRA by MD Physical Exam  ROS  Blood pressure 109/73, pulse 99, temperature 97.5 F (36.4 C), temperature source Oral, resp. rate 18, height 6\' 4"  (1.93 m), weight 98.9 kg (218 lb), SpO2 98 %.Body mass index is 26.54 kg/m.   Have you used any form of tobacco in the last 30 days? (Cigarettes, Smokeless Tobacco, Cigars, and/or Pipes): Patient Refused Screening  Has this patient used any form of tobacco in the last 30 days? (Cigarettes, Smokeless Tobacco, Cigars, and/or Pipes) No  Blood Alcohol level:  Lab Results  Component Value Date   ETH 423 (HH) 06/30/2016   ETH 421 (HH) 05/05/2016    Metabolic Disorder Labs:  No results found for: HGBA1C, MPG No results found for: PROLACTIN No results found for: CHOL, TRIG, HDL, CHOLHDL, VLDL, LDLCALC  See Psychiatric Specialty Exam and Suicide Risk Assessment completed by Attending Physician prior to discharge.  Discharge destination:  Other:  [facility]  Is patient on multiple antipsychotic therapies at discharge:  No   Has Patient had three or more failed trials of antipsychotic monotherapy by history:  No  Recommended Plan for Multiple Antipsychotic Therapies: NA  Discharge Instructions    Diet - low sodium heart healthy    Complete by:  As directed    Discharge instructions    Complete by:  As directed    Take  all medications as prescribed. Keep all follow-up appointments as scheduled.  Do not consume alcohol or use illegal drugs while on prescription medications. Report any adverse effects from your medications to your primary care provider promptly.  In the event of recurrent symptoms or worsening symptoms, call 911, a crisis hotline, or go to the nearest emergency department for evaluation.   Increase activity slowly    Complete by:  As directed      Allergies as of 07/08/2016    No Known Allergies     Medication List    STOP taking these medications   chlordiazePOXIDE 25 MG capsule Commonly known as:  LIBRIUM   diphenhydramine-acetaminophen 25-500 MG Tabs tablet Commonly known as:  TYLENOL PM   thiamine 100 MG tablet     TAKE these medications     Indication  hydrOXYzine 50 MG tablet Commonly known as:  ATARAX/VISTARIL Take 1 tablet (50 mg total) by mouth every 6 (six) hours as needed for anxiety.  Indication:  Anxiety Neurosis   naltrexone 50 MG tablet Commonly known as:  DEPADE Take 1 tablet (50 mg total) by mouth daily.  Indication:  Excessive Use of Alcohol   nicotine 21 mg/24hr patch Commonly known as:  NICODERM CQ - dosed in mg/24 hours Place 1 patch (21 mg total) onto the skin daily.  Indication:  Nicotine Addiction   traZODone 50 MG tablet Commonly known as:  DESYREL Take 1 tablet (50 mg total) by mouth at bedtime and may repeat dose one time if needed.  Indication:  Trouble Sleeping      Follow-up Information    [facility] Follow up on 07/08/2016.   Why:  You have been accepted to [facility] for Monday at 4:00PM. (per Chrissie Noa in admissions).  Contact information: [address] [place], [place] [address] Phone: [phone] Fax: [phone]          Follow-up recommendations:  Activity:  as tolerated Diet:  heart healthy  Comments:  Take all medications as prescribed. Keep all follow-up appointments as scheduled.  Do not consume alcohol or use illegal drugs while on prescription medications. Report any adverse effects from your medications to your primary care provider promptly.  In the event of recurrent symptoms or worsening symptoms, call 911, a crisis hotline, or go to the nearest emergency department for evaluation.   Signed: Oneta Rack, NP 07/14/2016, 12:45 PM

## 2016-07-07 NOTE — [facility] Group Notes (Signed)
[facility] Group Notes:  (Nursing/MHT/Case Management/Adjunct)  Date:  07/07/2016  Time:  2:34 PM  Type of Therapy:  Nurse Education  Participation Level:  Active  Participation Quality:  Sharing and Supportive  Affect:  Appropriate  Cognitive:  Alert and Oriented  Insight:  Improving  Engagement in Group:  Developing/Improving, Engaged and Supportive  Modes of Intervention:  Discussion, Education and Support  Summary of Progress/Problems: Attended group on healthy support systems; identified qualities that have been both good and bad supports for him and said that he could be supportive by listening and not offering advice.  Vinetta BergamoBarbara M Guila Owensby 07/07/2016, 2:34 PM

## 2016-07-07 NOTE — [facility] Group Notes (Signed)
[facility] LCSW Group Therapy  07/07/2016  At 10:15 AM  Type of Therapy:  Group Therapy ~ Preparing for Success  Participation Level:  Active  Participation Quality:  Attentive and Sharing  Affect:  Depressed  Cognitive:  Appropriate  Insight:  Engaged  Engagement in Therapy:  Engaged  Modes of Intervention:  Clarification, Discussion, Exploration, Limit-setting, Rapport Building, Socialization and Support  Summary of Progress/Problems: Process group was focused on exploring what success may look and feel like. Patients were asked to share a sign of potential success and some were unabile to identify potential success. Motivational interviewing was used to help patient reach outside their comfort zone and identify characteristics they would like to have in their life. Patients were able to process their feelings related to change.  Patient shared importance of having attainable short term goals and a schedule with consistency for himself.     Carney Bernatherine C Harrill, LCSW

## 2016-07-07 NOTE — [facility] Suicide Risk Assessment (Signed)
[facility]   Principal Problem: Alcohol use disorder, severe, dependence (HCC) Discharge Diagnoses:  Patient Active Problem List   Diagnosis Date Noted  . Major depressive disorder, recurrent severe without psychotic features (HCC) [F33.2] 07/01/2016  . Major depressive disorder, single episode, severe without psychosis (HCC) [F32.2] 07/01/2016  . Acute alcoholic hepatitis [K70.10] 09/18/2015  . Substance induced mood disorder (HCC) [F19.94] 09/18/2015  . Chronic post-traumatic stress disorder (PTSD) [F43.12] 09/18/2015  . Alcohol use disorder, severe, dependence (HCC) [F10.20] 09/17/2015  . Alcohol-induced mood disorder (HCC) [F10.94] 09/09/2015  . Alcohol dependence with uncomplicated withdrawal (HCC) [F10.230] 09/09/2015  . Alcohol withdrawal (HCC) [F10.239] 09/09/2015  . Tobacco abuse [Z72.0] 09/09/2015  . Depression [F32.9] 09/09/2015  . Elevated LFTs [R79.89] 09/09/2015  . Hypertension [I10] 09/09/2015  . Panic attack [F41.0] 03/16/2015  . Alcohol abuse [F10.10] 03/16/2015    Total Time spent with patient: 30 minutes  Musculoskeletal: Strength & Muscle Tone: within normal limits Gait & Station: normal Patient leans: N/A  Psychiatric Specialty Exam: Review of Systems  Constitutional: Negative.   HENT: Negative.   Eyes: Negative.   Respiratory: Negative.   Cardiovascular: Negative.   Gastrointestinal: Negative.   Genitourinary: Negative.   Musculoskeletal: Negative.   Skin: Negative.   Neurological: Negative.   Endo/Heme/Allergies: Negative.   Psychiatric/Behavioral: Negative for depression, hallucinations, memory loss and suicidal ideas. The patient is not nervous/anxious and does not have insomnia.     Blood pressure 117/65, pulse (!) 102, temperature 97.5 F (36.4 C), temperature source Oral, resp. rate 16, height 6\' 4"  (1.93 m), weight 98.9 kg (218 lb), SpO2 98 %.Body mass index is 26.54 kg/m.  General Appearance: Neatly dressed,  pleasant, engaging well and cooperative. Appropriate behavior. Not in any distress. Good relatedness. Not internally stimulated  Eye Contact::  Good  Speech:  Spontaneous, normal prosody. Normal tone and rate.   Volume:  Normal  Mood:  Euthymic  Affect:  Appropriate and Full Range  Thought Process:  Linear  Orientation:  Full (Time, Place, and Person)  Thought Content:  No delusional theme. No preoccupation with violent thoughts. No negative ruminations. No obsession.  No hallucination in any modality.   Suicidal Thoughts:  No  Homicidal Thoughts:  No  Memory:  Immediate;   Good Recent;   Good Remote;   Good  Judgement:  Good  Insight:  Good  Psychomotor Activity:  Normal  Concentration:  Good  Recall:  Good  Fund of Knowledge:Good  Language: Good  Akathisia:  No  Handed:    AIMS (if indicated):     Assets:  Communication Skills Desire for Improvement Financial Resources/Insurance Housing Physical Health Resilience  Sleep:  Number of Hours: 6  Cognition: WNL  ADL's:  Intact   Clinical  Assessment::   36 yo Caucasian male, single, lives alone, unemployed. Background history of Alcohol Use Disorder. Presented to the ER in company of his family. Intoxicated at presentation with BAL 423 mg/dl. Admitted for alcohol detox.  Seen today. In good spirits. Optimistic about the future. Not feeling depressed. No thoughts of self harm. No thoughts of homicide. No thoughts of violence. No evidence of mania. No evidence of psychosis.  Nursing staff reports that patient has been appropriate on the unit. Patient has been interacting well with peers. No behavioral issues. Patient has not voiced any suicidal thoughts. Patient has not been observed to be internally stimulated. Patient has been adherent with treatment recommendations. Patient has been tolerating their medication well.   Demographic  Factors:  Male and Caucasian  Loss Factors: Loss of significant relationship  Historical  Factors: NA  Risk Reduction Factors:   Sense of responsibility to family, Positive social support, Positive therapeutic relationship and Positive coping skills or problem solving skills  Continued Clinical Symptoms:  None  Cognitive Features That Contribute To Risk:  None    Suicide Risk:  Minimal: No identifiable suicidal ideation.   Patient is not having any thoughts of suicide at this time. Modifiable risk factors targeted during this admission is Alcohol use disorder. Patient has sucessfully detoxed from alcohol. Demographical and historical risk factors cannot be modified. Patient is now engaging well. Patient is reliable and is future oriented. We have buffered patient's support structures. At this point, patient is at low risk of suicide. Patient is aware of the effects of psychoactive substances on decision making process. Patient has been provided with emergency contacts. Patient acknowledges to use resources provided if unforseen circumstances changes their current risk stratification.    Follow-up Information    [facility] Follow up on 07/08/2016.   Why:  You have been accepted to [facility] for Monday at 4:00PM. (per Chrissie Noa in admissions).  Contact information: [address] [place], [place] [address] Phone: [phone] Fax: [phone]          Plan Of Care/Follow-up recommendations:  1. Continue current psychotropic medications 2. Mental health and addiction follow up as arranged.     Georgiann Cocker, MD 07/07/2016, 3:02 PM

## 2016-07-08 NOTE — Progress Notes (Signed)
Recreation Therapy Notes  Date: 07/08/16 Time: 0930 Location: [facility] Dayroom  Group Topic: Stress Management  Goal Area(s) Addresses:  Patient will verbalize importance of using healthy stress management.  Patient will identify positive emotions associated with healthy stress management.   Behavioral Response: Engaged  Intervention: Stress Management  Activity :  Guided Imagery.  LRT introduced to thebstress management technique guided imagery.  LRT read a script that allowed patients to take a "mental vacation" from their surroundings.  Patients were to follow along as LRT read script to engage in the technique.   Education:  Stress Management, Discharge Planning.   Education Outcome: Acknowledges edcuation/In group clarification offered/Needs additional education  Clinical Observations/Feedback: Pt attended group.    Caroll RancherMarjette Janisa Reid, LRT/CTRS         Caroll RancherLindsay, Estee Yohe A 07/08/2016 11:53 AM

## 2016-07-08 NOTE — Tx Team (Signed)
Interdisciplinary Treatment and Diagnostic Plan Update  07/08/2016 Time of Session: 0900 Christopher Reid MRN: [id number]  Principal Diagnosis: Alcohol use disorder, severe, dependence (HCC)  Secondary Diagnoses: Principal Problem:   Alcohol use disorder, severe, dependence (HCC) Active Problems:   Major depressive disorder, single episode, severe without psychosis (HCC)   Current Medications:  Current Facility-Administered Medications  Medication Dose Route Frequency Provider Last Rate Last Dose  . alum & mag hydroxide-simeth (MAALOX/MYLANTA) 200-200-20 MG/5ML suspension 30 mL  30 mL Oral Q4H PRN Jamison Y Lord, NP      . hydrOXYzine (ATARAX/VISTARIL) tablet 50 mg  50 mg Oral Q6H PRN Laurie Britton Parks, NP   50 mg at 07/06/16 1847  . ibuprofen (ADVIL,MOTRIN) tablet 600 mg  600 mg Oral Q6H PRN Spencer E Simon, PA-C   600 mg at 07/05/16 1711  . magnesium hydroxide (MILK OF MAGNESIA) suspension 30 mL  30 mL Oral Daily PRN Jamison Y Lord, NP      . multivitamin with minerals tablet 1 tablet  1 tablet Oral Daily Sheila Agustin, NP   1 tablet at 07/08/16 0759  . naltrexone (DEPADE) tablet 50 mg  50 mg Oral Daily Vincent A Izediuno, MD   50 mg at 07/08/16 0759  . nicotine (NICODERM CQ - dosed in mg/24 hours) patch 21 mg  21 mg Transdermal Daily Sheila Agustin, NP   21 mg at 07/07/16 0754  . thiamine (VITAMIN B-1) tablet 100 mg  100 mg Oral Daily Sheila Agustin, NP   100 mg at 07/08/16 0759  . traZODone (DESYREL) tablet 50 mg  50 mg Oral QHS,MR X 1 Spencer E Simon, PA-C   50 mg at 07/07/16 2311   PTA Medications: Prescriptions Prior to Admission  Medication Sig Dispense Refill Last Dose  . chlordiazePOXIDE (LIBRIUM) 25 MG capsule 50mg PO TID x 1D, then 25-50mg PO BID X 1D, then 25-50mg PO QD X 1D (Patient not taking: Reported on 06/30/2016) 10 capsule 0 Not Taking at Unknown time  . diphenhydramine-acetaminophen (TYLENOL PM) 25-500 MG TABS tablet Take 1 tablet by mouth at bedtime as  needed.   Past Month at Unknown time  . nicotine (NICODERM CQ - DOSED IN MG/24 HOURS) 21 mg/24hr patch Place 1 patch (21 mg total) onto the skin daily. (Patient not taking: Reported on 05/05/2016) 28 patch 0 Not Taking at Unknown time  . thiamine 100 MG tablet Take 1 tablet (100 mg total) by mouth daily. (Patient not taking: Reported on 05/05/2016) 30 tablet 0 Not Taking at Unknown time    Patient Stressors: Substance abuse  Patient Strengths: Average or above average intelligence Capable of independent living Communication skills  Treatment Modalities: Medication Management, Group therapy, Case management,  1 to 1 session with clinician, Psychoeducation, Recreational therapy.   Physician Treatment Plan for Primary Diagnosis: Alcohol use disorder, severe, dependence (HCC) Long Term Goal(s): Improvement in symptoms so as ready for discharge Improvement in symptoms so as ready for discharge   Short Term Goals: Ability to identify triggers associated with substance abuse/mental health issues will improve Ability to verbalize feelings will improve Ability to disclose and discuss suicidal ideas Ability to demonstrate self-control will improve Ability to identify and develop effective coping behaviors will improve  Medication Management: Evaluate patient's response, side effects, and tolerance of medication regimen.  Therapeutic Interventions: 1 to 1 sessions, Unit Group sessions and Medication administration.  Evaluation of Outcomes: Met Physician Treatment Plan for Secondary Diagnosis: Principal Problem:   Alcohol use disorder, severe, dependence (  HCC) Active Problems:   Major depressive disorder, single episode, severe without psychosis (HCC)  Long Term Goal(s): Improvement in symptoms so as ready for discharge Improvement in symptoms so as ready for discharge   Short Term Goals: Ability to identify triggers associated with substance abuse/mental health issues will improve Ability  to verbalize feelings will improve Ability to disclose and discuss suicidal ideas Ability to demonstrate self-control will improve Ability to identify and develop effective coping behaviors will improve     Medication Management: Evaluate patient's response, side effects, and tolerance of medication regimen.  Therapeutic Interventions: 1 to 1 sessions, Unit Group sessions and Medication administration.  Evaluation of Outcomes: Met   RN Treatment Plan for Primary Diagnosis: Alcohol use disorder, severe, dependence (HCC) Long Term Goal(s): Knowledge of disease and therapeutic regimen to maintain health will improve  Short Term Goals: Ability to remain free from injury will improve and Ability to verbalize frustration and anger appropriately will improve  Medication Management: RN will administer medications as ordered by provider, will assess and evaluate patient's response and provide education to patient for prescribed medication. RN will report any adverse and/or side effects to prescribing provider.  Therapeutic Interventions: 1 on 1 counseling sessions, Psychoeducation, Medication administration, Evaluate responses to treatment, Monitor vital signs and CBGs as ordered, Perform/monitor CIWA, COWS, AIMS and Fall Risk screenings as ordered, Perform wound care treatments as ordered.  Evaluation of Outcomes:  Met   LCSW Treatment Plan for Primary Diagnosis: Alcohol use disorder, severe, dependence (HCC) Long Term Goal(s): Safe transition to appropriate next level of care at discharge, Engage patient in therapeutic group addressing interpersonal concerns.  Short Term Goals: Engage patient in aftercare planning with referrals and resources  Therapeutic Interventions: Assess for all discharge needs, 1 to 1 time with Social worker, Explore available resources and support systems, Assess for adequacy in community support network, Educate family and significant other(s) on suicide prevention,  Complete Psychosocial Assessment, Interpersonal group therapy.  Evaluation of Outcomes: Met   Progress in Treatment: Attending groups: Yes. Participating in groups: Yes. Taking medication as prescribed: Yes. Toleration medication: Yes. Family/Significant other contact made: SPE completed with stepmother.  Patient understands diagnosis: Yes. Discussing patient identified problems/goals with staff: Yes. Medical problems stabilized or resolved: Yes. Denies suicidal/homicidal ideation: Yes, self report.  Issues/concerns per patient self-inventory: Yes. Other:   New problem(s) identified: No, Describe:  none reported  New Short Term/Long Term Goal(s): detox; medication stabilization; development of comprehensive mental wellness/sobriety plan.   Discharge Plan or Barriers:  [facility] accepted at 4pm today.   Reason for Continuation of Hospitalization: none  Estimated Length of Stay: d/c today   Attendees: Patient: 07/08/2016 9:19 AM  Physician: Dr. Clary MD 07/08/2016 9:19 AM  Nursing: Britney Tyson RN; Dan RN 07/08/2016 9:19 AM  RN Care Manager:  Jennifer, RN 07/08/2016 9:19 AM  Social Worker: Heather Smart, LCSW; Lauren Newton LCSW 07/08/2016 9:19 AM  Recreational Therapist: X 07/08/2016 9:19 AM  Other: Agnes Nwoko NP;  07/08/2016 9:19 AM  Other:  07/08/2016 9:19 AM  Other: 07/08/2016 9:19 AM    Scribe for Treatment Team: Heather N Smart, LCSW 07/08/2016 9:19 AM 

## 2016-07-08 NOTE — Progress Notes (Signed)
  [facility]  Will you be returning to the same living situation after discharge:  No.[facility]  At discharge, do you have transportation home?: Yes,  pt's stepmother Do you have the ability to pay for your medications: Yes,  [employer]  Release of information consent forms completed and submitted to medical records by CSW.  Patient to Follow up at: Follow-up Information    [facility] Follow up on 07/08/2016.   Why:  You have been accepted to [facility] for Monday at 4:00PM. (per Chrissie NoaWilliam in admissions).  Contact information: [address] [place], [place] [address] Phone: [phone] Fax: [phone]          Next level of care provider [facility]:no  Safety Planning and Suicide Prevention discussed: Yes,  SPE completed with pt's mother.  Have you used any form of tobacco in the last 30 days? (Cigarettes, Smokeless Tobacco, Cigars, and/or Pipes): Patient Refused Screening  Has patient been referred to the Quitline?: N/A patient is not a smoker  Patient has been referred for addiction treatment: Yes  Christopher Jenison N Smart LCSW 07/08/2016, 9:15 AM

## 2016-07-08 NOTE — Progress Notes (Signed)
Patient discharged per physician order; patient denies SI/HI and A/V hallucinations; patient received prescriptions, samples, AVS, copy of the suicide safety plan, suicide risk assessment note, and transition record given to the patient after it was reviewed; patient had no other questions or concerns at this time; patient verbalized and signed that all belongings were returned; patient left the unit ambulatory 

## 2017-08-08 IMAGING — CR DG HAND COMPLETE 3+V*R*
3 series · 3 of 3 positions shown · non-contrast
Comparison: None.

CLINICAL DATA: Punched a cabinet yesterday. Fourth and fifth
metacarpal region pain with bruising. Initial encounter.

EXAM:
RIGHT HAND - COMPLETE 3+ VIEW

[x hand pa right]
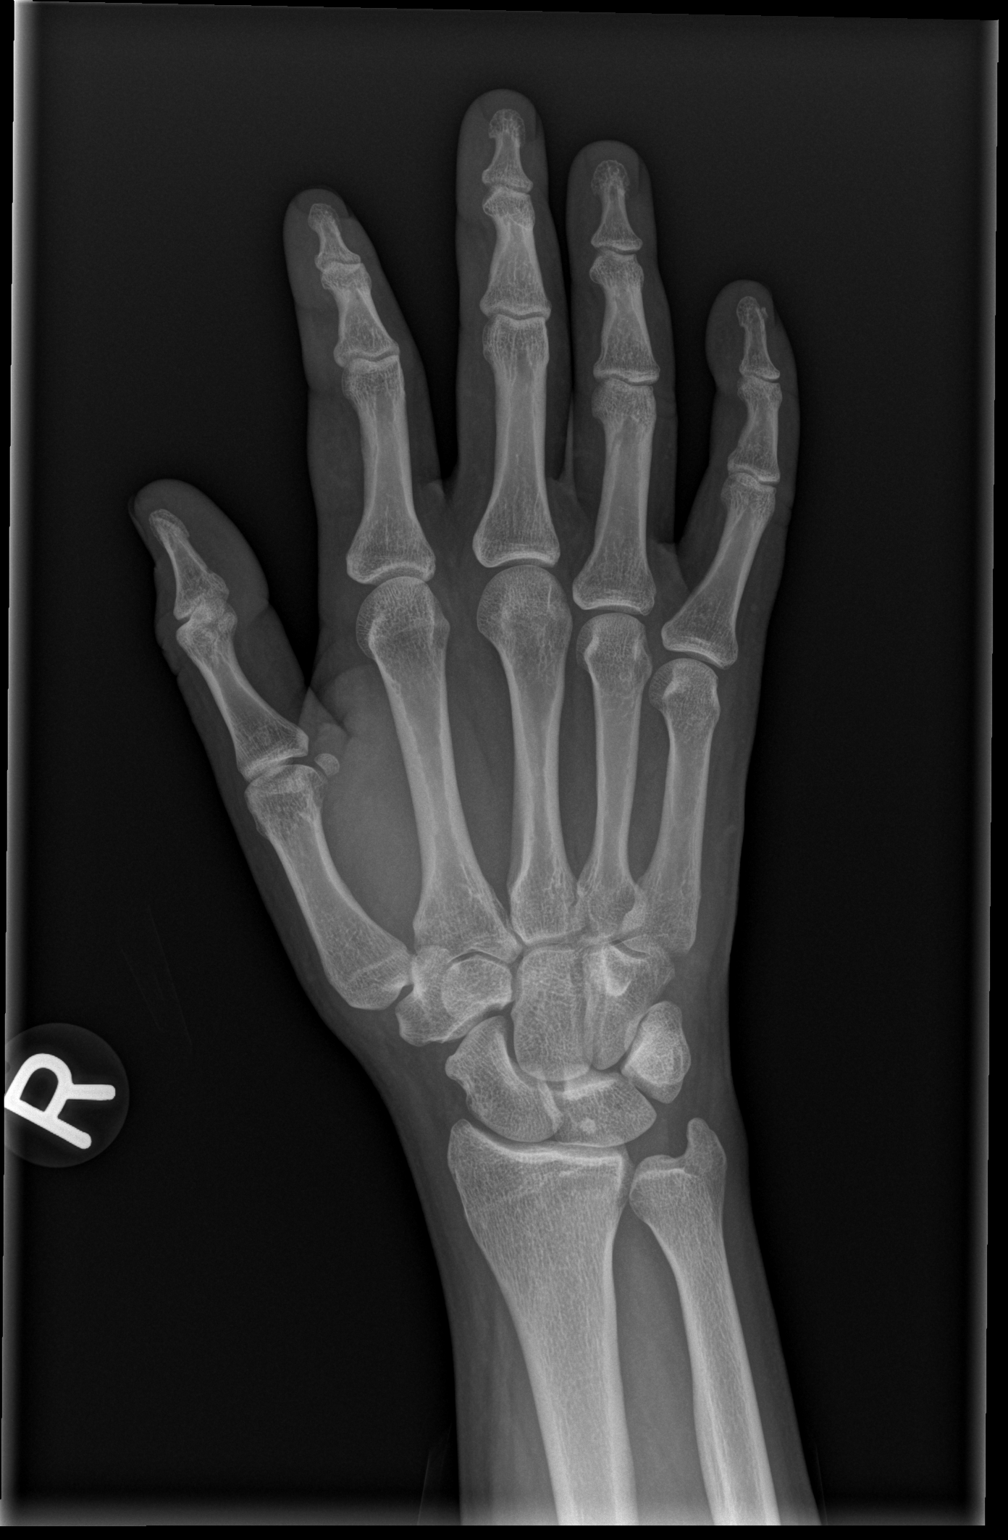

[x hand obl right]
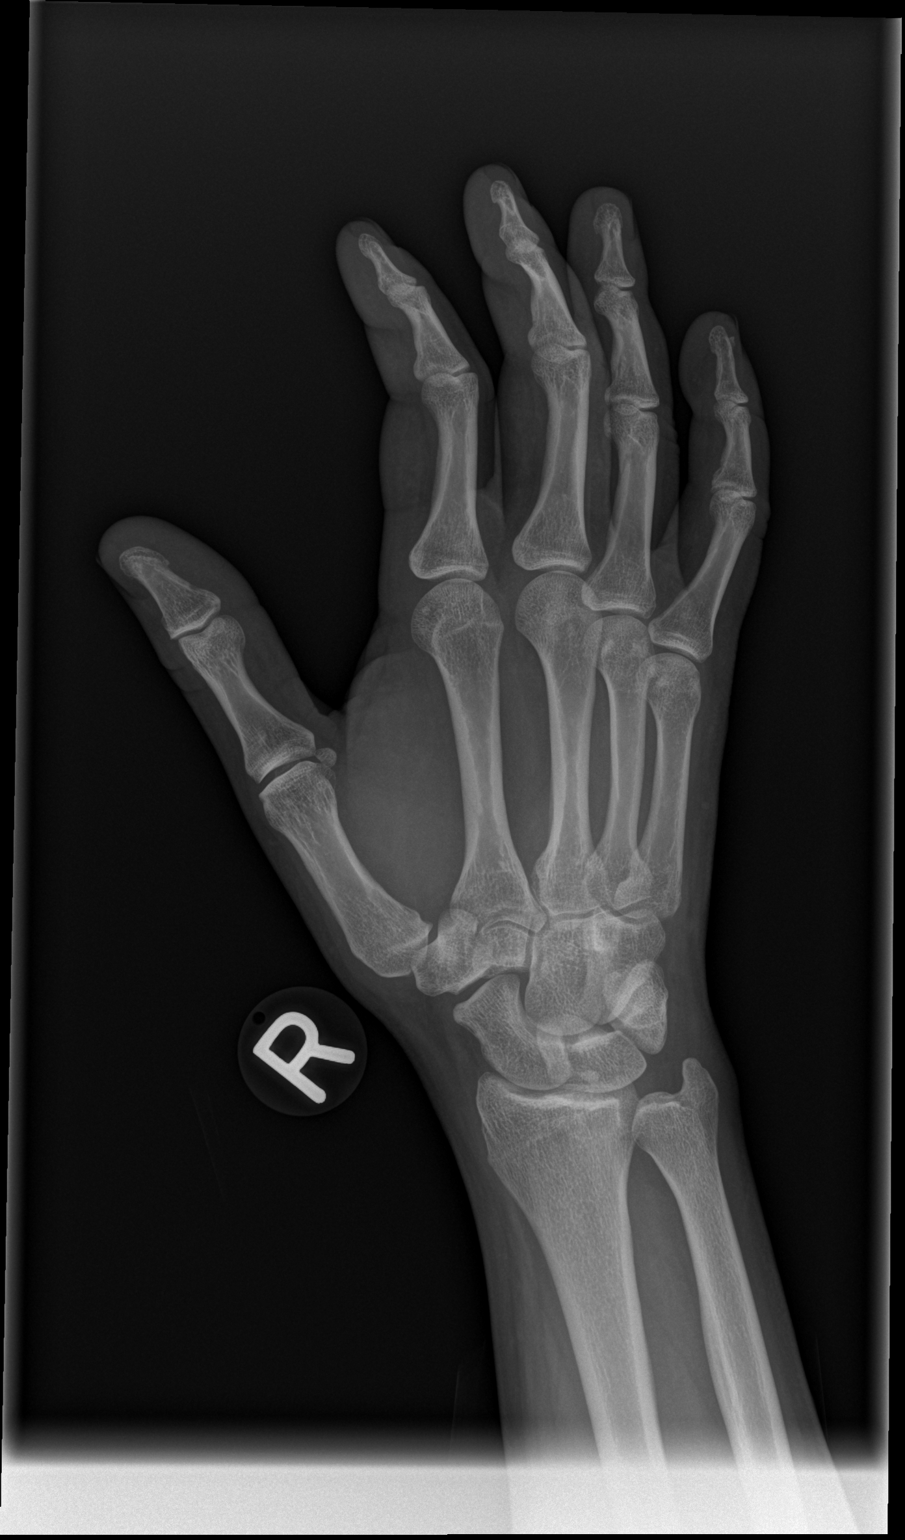

[x hand lat right]
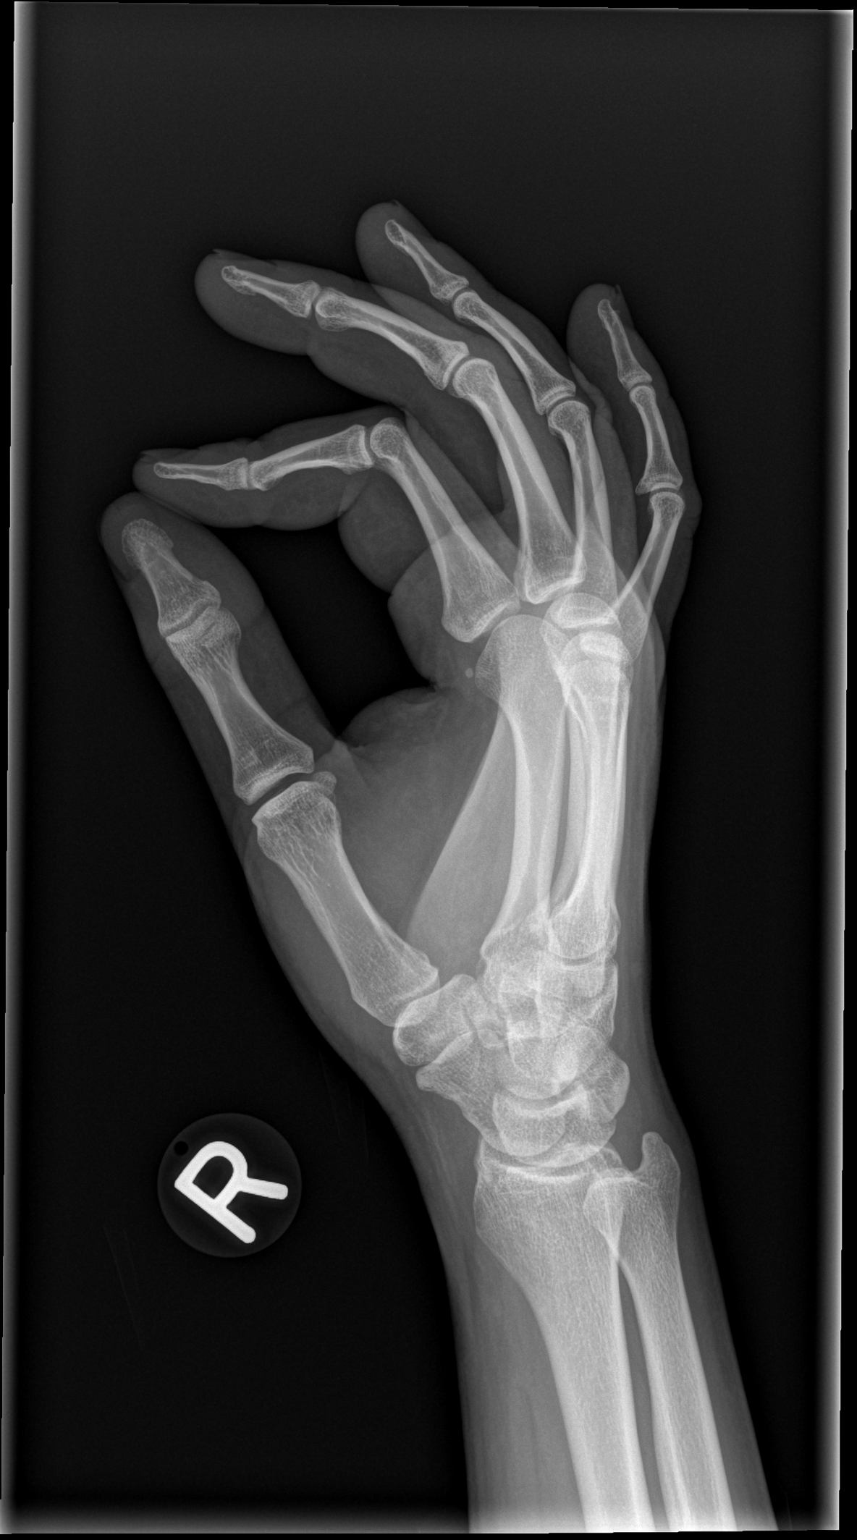

[3 of 3 positions shown; findings below may reference images not displayed]

FINDINGS: There is no evidence of acute fracture or dislocation. A 2 mm focus
of calcification is noted adjacent to the distal phalanx of the
small finger without evidence of underlying fracture. A 3 mm
sclerotic focus in the lunate may represent a bone island. Joint
space widths are preserved. No focal soft tissue abnormality is
seen.
IMPRESSION: No acute osseous abnormality identified.
